# Patient Record
Sex: Female | Born: 1975
Health system: Southern US, Community
[De-identification: ages and names within clinical notes are randomized; demographics above are authoritative.]

## PROBLEM LIST (undated history)

## (undated) DIAGNOSIS — O09529 Supervision of elderly multigravida, unspecified trimester: Secondary | ICD-10-CM

## (undated) DIAGNOSIS — Z789 Other specified health status: Secondary | ICD-10-CM

## (undated) HISTORY — PX: NO PAST SURGERIES: SHX2092

---

## 2014-11-15 ENCOUNTER — Ambulatory Visit (INDEPENDENT_AMBULATORY_CARE_PROVIDER_SITE_OTHER): Payer: PRIVATE HEALTH INSURANCE | Admitting: Gynecology

## 2014-11-15 ENCOUNTER — Encounter: Payer: Self-pay | Admitting: Gynecology

## 2014-11-15 ENCOUNTER — Other Ambulatory Visit (HOSPITAL_COMMUNITY)
Admission: RE | Admit: 2014-11-15 | Discharge: 2014-11-15 | Disposition: A | Discharge status: Home / Self Care | Payer: Managed Care, Other (non HMO) | Source: Ambulatory Visit | Attending: Gynecology | Admitting: Gynecology

## 2014-11-15 VITALS — BP 130/80 | Ht 63.25 in | Wt 171.0 lb

## 2014-11-15 DIAGNOSIS — N979 Female infertility, unspecified: Secondary | ICD-10-CM | POA: Diagnosis not present

## 2014-11-15 DIAGNOSIS — N915 Oligomenorrhea, unspecified: Secondary | ICD-10-CM | POA: Diagnosis not present

## 2014-11-15 DIAGNOSIS — Z113 Encounter for screening for infections with a predominantly sexual mode of transmission: Secondary | ICD-10-CM | POA: Diagnosis not present

## 2014-11-15 DIAGNOSIS — Z124 Encounter for screening for malignant neoplasm of cervix: Secondary | ICD-10-CM | POA: Diagnosis not present

## 2014-11-15 DIAGNOSIS — N941 Dyspareunia: Secondary | ICD-10-CM | POA: Diagnosis not present

## 2014-11-15 DIAGNOSIS — Z1151 Encounter for screening for human papillomavirus (HPV): Secondary | ICD-10-CM | POA: Diagnosis present

## 2014-11-15 DIAGNOSIS — Z01419 Encounter for gynecological examination (general) (routine) without abnormal findings: Secondary | ICD-10-CM | POA: Diagnosis not present

## 2014-11-15 DIAGNOSIS — IMO0002 Reserved for concepts with insufficient information to code with codable children: Secondary | ICD-10-CM | POA: Insufficient documentation

## 2014-11-15 LAB — TSH: TSH: 0.989 u[IU]/mL (ref 0.350–4.500)

## 2014-11-15 MED ORDER — DOXYCYCLINE HYCLATE 100 MG PO CAPS: 100.0000 mg | ORAL_CAPSULE | Freq: Two times a day (BID) | ORAL | Status: DC

## 2014-11-15 MED ORDER — MEDROXYPROGESTERONE ACETATE 10 MG PO TABS: 10.0000 mg | ORAL_TABLET | Freq: Every day | ORAL | Status: DC

## 2014-11-15 NOTE — Progress Notes (Signed)
   Patient a 39 year old gravida 0 para 0 who presented to the office today with several complaints as follows: #1 dyspareunia #2 irregular menstrual cycles where she would go up to 2-3 months without having any menses #3 not able to conceive over the past several years  Patient has been married for 3 years and has had unprotected intercourse and has been unable to conceive. She states that she will go up to 3 months sometimes without having a menstrual cycle. She denies any visual disturbances, unusual headaches or any double vision or nipple discharge. She states that her weight has remained about the same. She denies any GU or GI complaints. Her husband has had 2 children with a prior marriage. 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 Her last menstrual be was February 24. She stated her Pap smear was approximately 3 years ago and there was no report of any abnormal Pap smears in the past. She denies any past history of any sexually transmitted diseases or any pelvic surgery. Her alcohol consumption is only on a social basis she denies any use of illicit drugs or any smoking.  Exam: Blood pressure 130/80 weight 170 pounds height 5 feet 3 inches tall BMI 30.05 kg/m Gen. appearance: Well-developed well-nourished female in no acute distress Abdomen: Soft nontender no rebound or guarding Pelvic: Bartholin urethra Skene was within normal limits Vagina: No lesions or discharge Cervix: No lesions or discharge Uterus: Anteverted normal size shape and consistency Adnexa: No palpable mass or tenderness Rectal exam not done  Before bimanual exam was done a Pap smear was done with HPV screening along with a GC and Chlamydia culture  Assessment/plan: We with extensive discussion of causes and treatment of infertility. It appears a patient suffering from oligomenorrhea and for this reason we are checking her TSH, and prolactin will also check  an [facility] and LH in the event that she may have any signs of polycystic ovarian disease. A GC and Chlamydia culture was obtained result pending at time of this dictation a Pap smear with HPV screening was done today as well as. She is going to be scheduled for hysterosalpingogram to determine tubal patency after the start of her next cycle. If she does not start a spontaneous cycle within 30 days from her last cycle she is going to take Provera 10 mg 1 by mouth daily for 10 days of a home urine pregnancy test is negative. She was also prescribed Vibramycin 100 mg take 1 by mouth twice a day for 3 days starting the day before the HSG. Her husband also will be scheduled for semen analysis after 3 days of abstinence from intercourse. She was reminded to begin taking her prenatal vitamins for neural tube defect prophylaxis on a daily basis. She will then return to the office for consultation to discuss the results and possibly consider ovulation induction medication of the above tests are normal. I provided her with detail explanation of the above in Spanish as well as handwritten instructions were provided.

## 2014-11-15 NOTE — Patient Instructions (Signed)
Histerosalpingografa (Hysterosalpingography) La histerosalpingografa es un procedimiento para observar el interior del tero y las trompas de Falopio. Durante este procedimiento, se inyecta una sustancia de contraste en el tero, a travs de la vagina y el cuello del tero para poder visualizar el tero mientras se toman imgenes radiogrficas. Este procedimiento puede ayudar al mdico a diagnosticar tumores, adherencias o anormalidades estructurales en el tero. Generalmente, se utiliza para determinar las razones por las que una mujer no puede tener hijos (infertilidad). El procedimiento suele durar entre 15 y 30 minutos. INFORME A SU MDICO:  Cualquier alergia que tenga.  Todos los medicamentos que utiliza, incluidos vitaminas, hierbas, gotas oftlmicas, cremas y medicamentos de venta libre.  Problemas previos que usted o los miembros de su familia hayan tenido con el uso de anestsicos.  Enfermedades de la sangre.  Cirugas previas.  Afecciones mdicas que tenga. RIESGOS Y COMPLICACIONES  En general, se trata de un procedimiento seguro. Sin embargo, como en cualquier procedimiento, pueden surgir problemas. Estos son algunos posibles problemas:  Infeccin en el revestimiento interior del tero (endometritis) o en las trompas de Falopio (salpingitis).  Dao o perforacin del tero o las trompas de Falopio.  Reaccin alrgica a la sustancia de contraste utilizada para tomar la radiografa. ANTES DEL PROCEDIMIENTO   Debe planificar el procedimiento para despus que finalice su perodo, pero antes de su prxima ovulacin. Esto ocurre por lo general entre los das 5 y 10 de su ltimo perodo. El Da 1 es el primer da de su perodo.  Consulte a su mdico si debe cambiar o suspender los medicamentos que toma habitualmente.  Podr comer y beber normalmente.  Antes de comenzar el procedimiento, vace la vejiga. PROCEDIMIENTO  Es posible que le administren un medicamento para relajarse  (sedante) o un analgsico de venta libre para aliviar las molestias durante el procedimiento.  Deber acostarse en una mesa de radiografas con los pies en los estribos.  Le colocarn en la vagina un dispositivo llamado espculo. Esto permite al mdico observar el interior de la vagina hasta el cuello del tero.  El cuello del tero se lava con un jabn especial.  Luego se pasa un tubo delgado y flexible a travs del cuello del tero hasta el tero.  Por el tubo se colocar una sustancia de contraste.  Le tomarn varias radiografas a medida que el contraste pasa a travs del tero y las trompas de Falopio.  Despus del procedimiento se retira el tubo. DESPUS DEL PROCEDIMIENTO   La mayor parte de la sustancia de contraste se eliminar de la vagina naturalmente. Puede ser necesario que use un apsito sanitario.  Puede sentir clicos leves y notar una hemorragia vaginal leve. Luego de 24 horas deben desaparecer.  Pregunte cundo estarn listos los resultados. Asegrese de obtener los resultados. Document Released: 11/15/2011 Document Revised: 08/28/2013 ExitCare Patient Information 2015 [employer]. This information is not intended to replace advice given to you by your health care provider. Make sure you discuss any questions you have with your health care provider. Infertilidad (Infertility) QU ES LA INFERTILIDAD?  La infertilidad normalmente se define como la incapacidad para quedar embarazada luego de un ao de relaciones sexuales regulares sin la utilizacin de mtodos anticonceptivos. O la incapacidad de llevar a trmino un embarazo y tener el beb. La tasa de infertilidad en los [place] es de alrededor del 10%. El embarazo es el resultado de una cadena de sucesos. La mujer debe liberar el vulo de uno de sus ovarios (ovulacin).   El vulo debe fertilizarse con el esperma. Luego viaja a travs de las trompas de Falopio hacia el tero (matriz), donde se une a la pared del  tero y crece. El hombre debe tener suficiente esperma y el esperma debe unirse con el vulo (fertilizar) en el momento justo. El vulo fertilizado debe luego unirse al interior del tero. Esto parece simple, pero pueden ocurrir muchas cosas que evitan que el embarazo se produzca.  DE QUIN ES EL PROBLEMA?  Un 20% de los casos de infertilidad se deben a problemas del hombres (factores masculinos) y un 65% se debe a problemas de la mujer (factores femeninos). Otras causas pueden ser una combinacin de factores masculinos y femeninos o a causas desconocidas.  CULES SON LAS CAUSAS DE LA INFERTILIDAD EN EL HOMBRE?  La infertilidad en el hombre a menudo se debe a problemas para producir el esperma o hacer que el mismo llegue al vulo. Los problemas de esperma pueden existir desde el nacimiento o desarrollarse ms tarde debido a enfermedades o lesiones. Algunos hombres no producen esperma, o producen muy poco (oligospermia). Entre otros problemas se incluyen:  Disfuncin sexual  Problemas hormonales o endocrinos.  La edad. La fertilidad del hombre disminuye con la edad, pero no tan temprano como la femenina.  Infecciones.  Problemas congnitos Defectos de nacimiento, como ausencia de los tubos que transportan el esperma (conductos deferentes).  Problemas genticos o de cromosomas.  Problemas de anticuerpos antiesperma.  Eyaculacin retrgrada (el esperma va hacia la vejiga).  Varicoceles, espermatoceles o tumores en los testculos.  El estilo de vida puede influir en el nmero y la calidad del esperma del hombre.  El alcohol y las drogas pueden reducir temporalmente la calidad del esperma.  Las toxinas ambientales, como los pesticidas y el plomo, pueden ocasionar algunos casos de infertilidad en hombres. CULES SON LAS CAUSAS DE LA INFERTILIDAD EN LA MUJER?   La infertilidad en las mujeres est causada mayormente por problemas con la ovulacin. Sin ovulacin, el vulo no puede  fertilizarse.  Seales de problemas de ovulacin son perodos menstruales irregulares o ningn perodo.  Factores simples del estilo de vida, como el estrs, la dieta, o el entrenamiento deportivo, pueden afectar el balance hormonal de una persona.  La edad. La fertilidad comienza a disminuir en las mujeres alrededor de los 30 aos y empeora a partir de los 37.  Mucho menos a menudo, un desequilibrio hormonal por un problema mdico serio como un tumor en la glndula pituitaria, tiroides u otra enfermedad mdica crnica pueden ocasionar problemas de ovulacin.  Infecciones plvicas.  Sndrome de ovarios poliqusticos (aumento de hormonas masculinas, incapacidad de ovular).  Consumo de alcohol o drogas.  Toxinas ambientales, radiacin, pesticidas y ciertos qumicos.  La edad es un factor importante en la infertilidad femenina.  La capacidad de los ovarios de una mujer para producir vulos disminuye con la edad, especialmente despus de los 35 aos. Alrededor de un tercio de las parejas en las que la mujer tiene ms de 35 aos tendr problemas de infertilidad.  En el momento en el que alcanza la menopausia, cuando su perodo menstrual se detiene, la mujer ya no puede producir vulos ni quedar embarazada.  Otros problemas tambin pueden llevar a la infertilidad en las mujeres. Si las trompas de Falopio estn bloqueadas en uno o los dos extremos, el vulo no puede pasar a travs de los tubos hacia el tero. Tejido cicatrizal (adhesiones) en la pelvis que pueden obstruir las trompas. Esto puede dar como resultado   una enfermedad inflamatoria plvica, endometriosis o una ciruga por embarazo ectpico (en la que el vulo fertilizado se ha implantado fuera del tero) o cualquier ciruga plvica o abdominal que ocasione adherencias.  Tumores fibroides o plipos en el tero.  Anormalidades congnitas (de nacimiento) del tero.  Infecciones en el cuello del tero (cervicitis).  Estenosis cervical  (estrechamiento).  Mucosidad cervical anormal.  Sndrome poliqustico de los ovarios.  Tener relaciones sexuales muy seguidas (todos los das o 4 a 5 veces por semana).  Obesidad.  Anorexia.  Dficit nutricional.  Mucho ejercicio, con prdida de grasa corporal.  DES. Su madre ha recibido la hormona dietilstilbesterol cuando estaba embarazada de usted. CMO SE ANALIZA LA INFERTILIDAD?  Si ha estado tratando de quedar embarazada sin xito, podra querer buscar ayuda mdica. No debera esperar un ao de intentos sin xito antes de buscar ayuda profesional si:  Tiene mas de 35 aos de edad  Tiene razones para creer que podra tener problemas de fertilidad. Un examen mdico determinar las causas de infertilidad de la pareja, Normalmente el proceso comienza con:  Exmenes fsicos  Historias clnicas de ambos miembros de la pareja.  Historiales sexuales de ambos miembros de la pareja. Si no hay un problema evidente, como relaciones sexuales en tiempos inadecuados o ausencia de ovulacin, se necesitarn realizar anlisis.   Para el hombre, los anlisis normalmente comienzan con pruebas de semen para observar:  La cantidad de esperma.  La forma del esperma.  El movimiento del esperma.  Realizar un historial clnico y quirrgico completo.  Examen fsico.  Control de infecciones en los rganos reproductivos. Podrn realizarle anlisis hormonales.   Para la mujer, el primer paso del anlisis es saber si ovula cada mes. Hay diferentes modos de realizar esto. Por ejemplo, puede llevar un registro de los cambios en la temperatura corporal por la maana y la textura de la mucosidad cervical. Otra herramienta es un kit de prueba de ovulacin casero, que puede comprarse en la farmacia.  Tambin pueden realizarse controles de ovulacin en el consultorio mdico, mediante anlisis de sangre para observar los niveles de hormonas o pruebas de ultrasonido en los ovarios. Si la mujer est  ovulando, se necesitarn realizar ms exmenes. Algunas femeninas comunes incluyen:  Histerosalpingografa: Se realizan rayos x de las trompas de Falopio y el tero con una inyeccin de contraste. Mostrar si las trompas estn abiertas y la forma del tero.  Laparoscopa: Un anlisis de las trompas y otros rganos femeninos para encontrar enfermedades. Se utiliza un tubo con iluminacin llamado laparoscopio para observar el interior del abdomen.  Biopsia endometrial: Se toma una muestra del tejido del tero el primer da del perodo menstrual, para ver si el tejido le indica si est ovulando.  Prueba de ultrasonido transvaginal: Examina los rganos femeninos.  Histeroscopa: Utiliza un tubo con iluminacin para examinar el cuello del tero y el tero y ver si hay anormalidades dentro del mismo. TRATAMIENTO Dependiendo de los resultados de las pruebas, se sugerirn tratamientos diferentes. El tratamiento depende de la causa. Entre el 85 y el 90% de los casos de infertilidad se tratan con drogas o ciruga.   Hay varias drogas para la fertilidad que pueden utilizarse para las mujeres con problemas de ovulacin. Es importante hablar con el profesional que la asiste sobre la droga a utilizar. Deber comprender los beneficios y efectos colaterales de las drogas. Segn el tipo de droga para la fertilidad y la dosis utilizada, algunas mujeres podran tener embarazos mltiples (mellizos).  De ser   necesario, se puede realizar una ciruga para reparar daos en los ovarios de la mujer, trompas de Falopio, el cuello del tero o el tero.  Tratamiento quirrgico o mdico para la endometriosis o el sndrome de ovario poliqustico. A veces, los problemas de fertilidad en el hombre pueden corregirse con medicamentos o ciruga.  Inseminacin intrauterina, IUI, de esperma en concordancia con la ovulacin.  Cambios en el estilo de vida, si esta es la causa (prdida de peso, aumento de ejercicios, dejar de fumar,  beber excesivamente o tomar drogas ilegales).  Otros tipos de ciruga:  Extirpar tumores por dentro o sobre el tero.  Eliminar tejido cicatrizal de dentro del tero.  Arreglar trompas obstruidas.  Eliminar tejido cicatrizal en la pelvis y alrededor de los rganos femeninos. QU ES LA TECNOLOGA DE REPRODUCCIN ASISTIDA (ART)?  La tecnologa de reproduccin asistida (ART) utiliza mtodos especiales para ayudar a parejas infrtiles. En esta tcnica se manipula tanto el vulo de la mujer como el esperma del hombre. El xito depende de muchos factores. La ART puede ser cara y demandar mucho tiempo. Pero ha hecho posible que muchas parejas tengan hijos que de otra manera no hubieran podido. A continuacin se enumeran algunos mtodos:  Fertilizacin. In Vitro (FIV). In Vitro (IVF) es un procedimiento que se hizo famoso con el nacimiento en 1978 de Louise Brown, el primer "beb de probeta" del mundo. Se utiliza cuando las trompas de Falopio de la mujer estn bloqueadas o cuando el hombre tiene poco esperma. Se utiliza una droga para estimular los ovarios y producir mltiples vulos. Una vez maduros, los vulos se retiran y se colocan en una placa de cultivo con el esperma del hombre para la fertilizacin. Luego de aproximadamente 40 horas, los vulos se examinan para ver si han sido fertilizados por el esperma y se han dividido en clulas. Esos vulos fertilizados (embriones) se colocan luego en el tero de la mujer. Esto evita el paso por las trompas de Falopio.  La transferencia intrafalopiana de gametas (GIFT) es similar al IVF, pero se utiliza cuando la mujer tiene al menos una trompa de falopio normal. Se colocan de tres a cinco vulos en la trompa de Falopio, junto con el esperma del hombre, para que la fertilizacin se realice dentro del cuerpo de la mujer.  La transferencia intrafalopiana de cigotos (ZIFT), tambin se denomina transferencia embrionaria, y combina el IVF con el GIFT. Los vulos  retirados de los ovarios de la mujer se fertilizan en el laboratorio y se colocan en las trompas de Falopio en lugar de en el tero.  Los procedimientos de ART a menudo suponen la utilizacin de donantes de vulos (de otra mujer) o de embriones congelados previamente. Los donantes de vulos pueden utilizarse si la mujer tiene problemas en los ovarios o posee una enfermedad gentica que podra transmitirla al beb.  Cuando se realiza una ART hay mayor riesgo de embarazos mltiples, mellizos, trillizos o ms.  La inyeccin de esperma intracitoplasmtica es un procedimiento que inyecta un espermatozoide en el vulo para fertilizarlo.  El trasplante embrionario es un procedimiento que comienza con un embrin que se ha desarrollado en un medio especial (solucin qumica) preparado para mantener el embrin vivo por 2 a 5 das, y luego trasplantarlo en el tero. En los casos en los que no puede encontrarse la causa y el embarazo no se produce, podr considerarse la adopcin. Document Released: 09/12/2007 Document Revised: 11/15/2011 ExitCare Patient Information 2015 [employer]. This information is not intended to replace   advice given to you by your health care provider. Make sure you discuss any questions you have with your health care provider.

## 2014-11-15 NOTE — Addendum Note (Signed)
Addended by: Thurnell Garbe A on: 11/15/2014 02:19 PM   Modules accepted: Orders

## 2014-11-16 LAB — PROLACTIN: PROLACTIN: 7.9 ng/mL

## 2014-11-16 LAB — LUTEINIZING HORMONE: LH: 1.3 m[IU]/mL

## 2014-11-16 LAB — GC/CHLAMYDIA PROBE AMP
CT PROBE, AMP APTIMA: NEGATIVE
GC Probe RNA: NEGATIVE

## 2014-11-16 LAB — FOLLICLE STIMULATING HORMONE: FSH: 2.4 m[IU]/mL

## 2014-11-19 LAB — CYTOLOGY - PAP

## 2014-12-27 ENCOUNTER — Ambulatory Visit (INDEPENDENT_AMBULATORY_CARE_PROVIDER_SITE_OTHER): Payer: PRIVATE HEALTH INSURANCE | Admitting: Women's Health

## 2014-12-27 ENCOUNTER — Ambulatory Visit: Payer: PRIVATE HEALTH INSURANCE | Admitting: Women's Health

## 2014-12-27 ENCOUNTER — Ambulatory Visit (INDEPENDENT_AMBULATORY_CARE_PROVIDER_SITE_OTHER): Payer: PRIVATE HEALTH INSURANCE

## 2014-12-27 ENCOUNTER — Encounter: Payer: Self-pay | Admitting: Women's Health

## 2014-12-27 ENCOUNTER — Other Ambulatory Visit: Payer: Self-pay | Admitting: Women's Health

## 2014-12-27 VITALS — BP 140/80 | Ht 63.0 in | Wt 177.0 lb

## 2014-12-27 DIAGNOSIS — N912 Amenorrhea, unspecified: Secondary | ICD-10-CM

## 2014-12-27 DIAGNOSIS — O09511 Supervision of elderly primigravida, first trimester: Secondary | ICD-10-CM

## 2014-12-27 DIAGNOSIS — O3680X0 Pregnancy with inconclusive fetal viability, not applicable or unspecified: Secondary | ICD-10-CM | POA: Diagnosis not present

## 2014-12-27 LAB — PREGNANCY, URINE: Preg Test, Ur: POSITIVE

## 2014-12-27 NOTE — Patient Instructions (Signed)
First Trimester of Pregnancy The first trimester of pregnancy is from week 1 until the end of week 12 (months 1 through 3). During this time, your baby will begin to develop inside you. At 6-8 weeks, the eyes and face are formed, and the heartbeat can be seen on ultrasound. At the end of 12 weeks, all the baby's organs are formed. Prenatal care is all the medical care you receive before the birth of your baby. Make sure you get good prenatal care and follow all of your doctor's instructions. HOME CARE  Medicines  Take medicine only as told by your doctor. Some medicines are safe and some are not during pregnancy.  Take your prenatal vitamins as told by your doctor.  Take medicine that helps you poop (stool softener) as needed if your doctor says it is okay. Diet  Eat regular, healthy meals.  Your doctor will tell you the amount of weight gain that is right for you.  Avoid raw meat and uncooked cheese.  If you feel sick to your stomach (nauseous) or throw up (vomit):  Eat 4 or 5 small meals a day instead of 3 large meals.  Try eating a few soda crackers.  Drink liquids between meals instead of during meals.  If you have a hard time pooping (constipation):  Eat high-fiber foods like fresh vegetables, fruit, and whole grains.  Drink enough fluids to keep your pee (urine) clear or pale yellow. Activity and Exercise  Exercise only as told by your doctor. Stop exercising if you have cramps or pain in your lower belly (abdomen) or low back.  Try to avoid standing for long periods of time. Move your legs often if you must stand in one place for a long time.  Avoid heavy lifting.  Wear low-heeled shoes. Sit and stand up straight.  You can have sex unless your doctor tells you not to. Relief of Pain or Discomfort  Wear a good support bra if your breasts are sore.  Take warm water baths (sitz baths) to soothe pain or discomfort caused by hemorrhoids. Use hemorrhoid cream if your  doctor says it is okay.  Rest with your legs raised if you have leg cramps or low back pain.  Wear support hose if you have puffy, bulging veins (varicose veins) in your legs. Raise (elevate) your feet for 15 minutes, 3-4 times a day. Limit salt in your diet. Prenatal Care  Schedule your prenatal visits by the twelfth week of pregnancy.  Write down your questions. Take them to your prenatal visits.  Keep all your prenatal visits as told by your doctor. Safety  Wear your seat belt at all times when driving.  Make a list of emergency phone numbers. The list should include numbers for family, friends, the hospital, and police and fire departments. General Tips  Ask your doctor for a referral to a local prenatal class. Begin classes no later than at the start of month 6 of your pregnancy.  Ask for help if you need counseling or help with nutrition. Your doctor can give you advice or tell you where to go for help.  Do not use hot tubs, steam rooms, or saunas.  Do not douche or use tampons or scented sanitary pads.  Do not cross your legs for long periods of time.  Avoid litter boxes and soil used by cats.  Avoid all smoking, herbs, and alcohol. Avoid drugs not approved by your doctor.  Visit your dentist. At home, brush your teeth  with a soft toothbrush. Be gentle when you floss. GET HELP IF:  You are dizzy.  You have mild cramps or pressure in your lower belly.  You have a nagging pain in your belly area.  You continue to feel sick to your stomach, throw up, or have watery poop (diarrhea).  You have a bad smelling fluid coming from your vagina.  You have pain with peeing (urination).  You have increased puffiness (swelling) in your face, hands, legs, or ankles. GET HELP RIGHT AWAY IF:   You have a fever.  You are leaking fluid from your vagina.  You have spotting or bleeding from your vagina.  You have very bad belly cramping or pain.  You gain or lose weight  rapidly.  You throw up blood. It may look like coffee grounds.  You are around people who have [place] measles, fifth disease, or chickenpox.  You have a very bad headache.  You have shortness of breath.  You have any kind of trauma, such as from a fall or a car accident. Document Released: 02/09/2008 Document Revised: 01/07/2014 Document Reviewed: 07/03/2013 [facility] Patient Information 2015 [place], [place]. This information is not intended to replace advice given to you by your health care provider. Make sure you discuss any questions you have with your health care provider.

## 2014-12-27 NOTE — Progress Notes (Signed)
Patient ID: [name], female   DOB: [birth year], 39 y.o.   MRN: [id number] Presents for follow-up of 3 positive home pregnancy tests. LMP 10/30/2014. History of infertility, dyspareunia, and irregular cycles. Denies cramping, bleeding, or abdominal pain. Father and mother both with diabetes, no personal history of. Not currently taking MVI.   Exam: Appears well,  +UPT.  Ultrasound: Intrauterine gestational sac seen with yolk sac. Fetal pole seen with FHT 166. Ovaries appear normal. No free fluid seen. T/V images.  First trimester  - 8 w 3 d  Plan: Congratulations given. Is aware she will need to establish with OB. Copies of ultrasound, Pap, and recent blood work given. Safe pregnancy behaviors reviewed. Prenatal vitamin daily recommended.

## 2015-02-06 ENCOUNTER — Encounter (HOSPITAL_COMMUNITY): Payer: Self-pay | Admitting: Obstetrics and Gynecology

## 2015-02-06 ENCOUNTER — Other Ambulatory Visit (HOSPITAL_COMMUNITY): Payer: Self-pay | Admitting: Obstetrics and Gynecology

## 2015-02-06 DIAGNOSIS — O280 Abnormal hematological finding on antenatal screening of mother: Secondary | ICD-10-CM

## 2015-02-06 DIAGNOSIS — O09522 Supervision of elderly multigravida, second trimester: Secondary | ICD-10-CM

## 2015-02-06 DIAGNOSIS — Z3A18 18 weeks gestation of pregnancy: Secondary | ICD-10-CM

## 2015-02-06 DIAGNOSIS — Z3689 Encounter for other specified antenatal screening: Secondary | ICD-10-CM

## 2015-03-05 ENCOUNTER — Other Ambulatory Visit (HOSPITAL_COMMUNITY): Payer: Managed Care, Other (non HMO)

## 2015-03-05 ENCOUNTER — Encounter (HOSPITAL_COMMUNITY): Payer: Managed Care, Other (non HMO)

## 2015-03-06 ENCOUNTER — Encounter (HOSPITAL_COMMUNITY): Payer: Self-pay

## 2015-03-06 ENCOUNTER — Ambulatory Visit (HOSPITAL_COMMUNITY)
Admission: RE | Admit: 2015-03-06 | Discharge: 2015-03-06 | Disposition: A | Discharge status: Home / Self Care | Payer: Managed Care, Other (non HMO) | Source: Ambulatory Visit | Attending: Obstetrics and Gynecology | Admitting: Obstetrics and Gynecology

## 2015-03-06 DIAGNOSIS — Z0000 Encounter for general adult medical examination without abnormal findings: Secondary | ICD-10-CM | POA: Insufficient documentation

## 2015-03-06 DIAGNOSIS — O09512 Supervision of elderly primigravida, second trimester: Secondary | ICD-10-CM | POA: Diagnosis not present

## 2015-03-06 DIAGNOSIS — O280 Abnormal hematological finding on antenatal screening of mother: Secondary | ICD-10-CM

## 2015-03-06 DIAGNOSIS — O283 Abnormal ultrasonic finding on antenatal screening of mother: Secondary | ICD-10-CM | POA: Diagnosis not present

## 2015-03-06 DIAGNOSIS — Z36 Encounter for antenatal screening of mother: Secondary | ICD-10-CM | POA: Insufficient documentation

## 2015-03-06 DIAGNOSIS — O09529 Supervision of elderly multigravida, unspecified trimester: Secondary | ICD-10-CM | POA: Insufficient documentation

## 2015-03-06 DIAGNOSIS — O09522 Supervision of elderly multigravida, second trimester: Secondary | ICD-10-CM

## 2015-03-06 DIAGNOSIS — Z3A18 18 weeks gestation of pregnancy: Secondary | ICD-10-CM | POA: Diagnosis not present

## 2015-03-06 DIAGNOSIS — Z3689 Encounter for other specified antenatal screening: Secondary | ICD-10-CM

## 2015-03-06 HISTORY — DX: Other specified health status: Z78.9

## 2015-03-06 HISTORY — DX: Supervision of elderly multigravida, unspecified trimester: O09.529

## 2015-03-07 ENCOUNTER — Encounter (HOSPITAL_COMMUNITY): Payer: Self-pay

## 2015-03-07 DIAGNOSIS — O09512 Supervision of elderly primigravida, second trimester: Secondary | ICD-10-CM | POA: Insufficient documentation

## 2015-03-07 NOTE — Progress Notes (Signed)
Genetic Counseling  High-Risk Gestation Note  Appointment Date:  03/06/2015 Referred By: Wendy Stammer, MD Date of Birth:  10/22/1975 Partner:  [name]   Pregnancy History: G1P0000 Estimated Date of Delivery: 08/06/15 Estimated Gestational Age: 77w1dAttending: PBenjaman Lobe MD   [name] and her partner, [name] were seen for genetic counseling regarding a maternal age of 39y.o. and an increased risk for Down syndrome based on Part I Sequential Screening.   In Summary:   Discussed maternal age related risk for fetal aneuploidy  First screen increased risk for Down syndrome (1:41)  Low PAPP-A (0.34 MoM) on first screen; Discussed associations with increased risk for adverse pregnancy outcomes  Detailed ultrasound performed today  Patient declined noninvasive prenatal screening (NIPS) and amniocentesis  Follow-up ultrasound scheduled for 04/16/15 to assess fetal growth and complete fetal anatomic survey  They were counseled regarding maternal age and the association with risk for chromosome conditions due to nondisjunction with aging of the ova.   We reviewed chromosomes, nondisjunction, and the associated 1 in 39risk for fetal aneuploidy related to a maternal age of 39y.o. at 39w1destation.  They were counseled that the risk for aneuploidy decreases as gestational age increases, accounting for those pregnancies which spontaneously abort.  We specifically discussed Down syndrome (trisomy 2156 trisomies 1376nd 1881and sex chromosome aneuploidies (47,XXX and 47,XXY) including the common features and prognoses of each.   We also reviewed [name]'s first trimester (Part 1 Sequential) screen result and the associated increase in risk for fetal Down syndrome (1 in 98 to 1 in 41).  They were counseled regarding other explanations for a screen positive result including normal variation and differences in maternal metabolism.  In addition, we  reviewed the increased but screen negative result for trisomy 18 (1 in 380 to 1 in 110).  They understand maternal serum screening provides a pregnancy specific risk for Down syndrome, but is not considered to be diagnostic.  We specifically discussed that the level of one of the proteins analyzed on the screen, PAPP-A, was very low (0.34 MoM).  This has been associated with an increased risk for growth restriction or poor pregnancy outcome later in pregnancy; therefore, we would recommend a follow up ultrasound for fetal growth in the third trimester.  We reviewed additional available screening options including noninvasive prenatal screening (NIPS)/cell free DNA (cfDNA) testing and detailed ultrasound.  They were counseled that screening tests are used to modify a patient's a priori risk for aneuploidy, typically based on age. This estimate provides a pregnancy specific risk assessment. We reviewed the benefits and limitations of each option. Specifically, we discussed the conditions for which each test screens, the detection rates, and false positive rates of each.   Targeted ultrasound was performed today. Visualized fetal anatomy appeared normal. Limited views of fetal spine, lips, and aortic arch were obtained today. Complete ultrasound results reported separately. They were also counseled regarding diagnostic testing via amniocentesis. We reviewed the approximate 1 in 30219-758isk for complications for amniocentesis, including spontaneous pregnancy loss. After consideration of all the options, they declined NIPS and amniocentesis. Follow-up ultrasound is scheduled in 6 weeks to reassess fetal growth and complete fetal anatomic survey. They understand that screening tests cannot rule out all birth defects or genetic syndromes. The patient was advised of this limitation and states she still does not want additional testing at this time.   Both family histories were reviewed and found to be  noncontributory  for birth defects, intellectual disability, and known genetic conditions. Without further information regarding the provided family history, an accurate genetic risk cannot be calculated. Further genetic counseling is warranted if more information is obtained.  [name] denied exposure to environmental toxins or chemical agents. She denied the use of alcohol, tobacco or street drugs. She denied significant viral illnesses during the course of her pregnancy. Her medical and surgical histories were noncontributory.   I counseled this couple regarding the above risks and available options.  The approximate face-to-face time with the genetic counselor was 35 minutes.  Wendy Oman, MS,  Certified Genetic Counselor 03/07/2015

## 2015-03-11 ENCOUNTER — Other Ambulatory Visit (HOSPITAL_COMMUNITY): Payer: Self-pay | Admitting: Obstetrics and Gynecology

## 2015-04-14 ENCOUNTER — Other Ambulatory Visit (HOSPITAL_COMMUNITY): Payer: Self-pay | Admitting: Maternal and Fetal Medicine

## 2015-04-14 DIAGNOSIS — O09522 Supervision of elderly multigravida, second trimester: Secondary | ICD-10-CM

## 2015-04-14 DIAGNOSIS — Z3A24 24 weeks gestation of pregnancy: Secondary | ICD-10-CM

## 2015-04-14 DIAGNOSIS — O289 Unspecified abnormal findings on antenatal screening of mother: Secondary | ICD-10-CM

## 2015-04-14 DIAGNOSIS — O09899 Supervision of other high risk pregnancies, unspecified trimester: Secondary | ICD-10-CM

## 2015-04-14 DIAGNOSIS — O280 Abnormal hematological finding on antenatal screening of mother: Secondary | ICD-10-CM

## 2015-04-15 ENCOUNTER — Other Ambulatory Visit (HOSPITAL_COMMUNITY): Payer: Self-pay | Admitting: Obstetrics and Gynecology

## 2015-04-15 DIAGNOSIS — Z3A24 24 weeks gestation of pregnancy: Secondary | ICD-10-CM

## 2015-04-15 DIAGNOSIS — O09899 Supervision of other high risk pregnancies, unspecified trimester: Secondary | ICD-10-CM

## 2015-04-15 DIAGNOSIS — O09522 Supervision of elderly multigravida, second trimester: Secondary | ICD-10-CM

## 2015-04-15 DIAGNOSIS — O289 Unspecified abnormal findings on antenatal screening of mother: Secondary | ICD-10-CM

## 2015-04-15 DIAGNOSIS — O280 Abnormal hematological finding on antenatal screening of mother: Secondary | ICD-10-CM

## 2015-04-16 ENCOUNTER — Ambulatory Visit (HOSPITAL_COMMUNITY)
Admission: RE | Admit: 2015-04-16 | Discharge: 2015-04-16 | Disposition: A | Discharge status: Home / Self Care | Payer: 59 | Source: Ambulatory Visit | Attending: Obstetrics and Gynecology | Admitting: Obstetrics and Gynecology

## 2015-04-16 DIAGNOSIS — O283 Abnormal ultrasonic finding on antenatal screening of mother: Secondary | ICD-10-CM | POA: Diagnosis present

## 2015-04-16 DIAGNOSIS — Z3A24 24 weeks gestation of pregnancy: Secondary | ICD-10-CM

## 2015-04-16 DIAGNOSIS — O09899 Supervision of other high risk pregnancies, unspecified trimester: Secondary | ICD-10-CM

## 2015-04-16 DIAGNOSIS — O289 Unspecified abnormal findings on antenatal screening of mother: Secondary | ICD-10-CM

## 2015-04-16 DIAGNOSIS — O09522 Supervision of elderly multigravida, second trimester: Secondary | ICD-10-CM | POA: Diagnosis not present

## 2015-04-16 DIAGNOSIS — O280 Abnormal hematological finding on antenatal screening of mother: Secondary | ICD-10-CM

## 2015-08-05 ENCOUNTER — Other Ambulatory Visit: Payer: Self-pay

## 2015-10-28 DIAGNOSIS — Z3040 Encounter for surveillance of contraceptives, unspecified: Secondary | ICD-10-CM | POA: Insufficient documentation

## 2016-01-09 ENCOUNTER — Encounter (HOSPITAL_COMMUNITY): Payer: Self-pay | Admitting: *Deleted

## 2017-01-19 ENCOUNTER — Encounter: Payer: Self-pay | Admitting: Gynecology

## 2018-03-10 DIAGNOSIS — H6981 Other specified disorders of Eustachian tube, right ear: Secondary | ICD-10-CM | POA: Diagnosis not present

## 2018-09-05 DIAGNOSIS — J0100 Acute maxillary sinusitis, unspecified: Secondary | ICD-10-CM | POA: Diagnosis not present

## 2018-09-26 DIAGNOSIS — Z0000 Encounter for general adult medical examination without abnormal findings: Secondary | ICD-10-CM | POA: Diagnosis not present

## 2018-09-26 DIAGNOSIS — E559 Vitamin D deficiency, unspecified: Secondary | ICD-10-CM | POA: Diagnosis not present

## 2018-09-26 DIAGNOSIS — Z6832 Body mass index (BMI) 32.0-32.9, adult: Secondary | ICD-10-CM | POA: Diagnosis not present

## 2018-09-26 DIAGNOSIS — Z1231 Encounter for screening mammogram for malignant neoplasm of breast: Secondary | ICD-10-CM | POA: Diagnosis not present

## 2018-10-12 ENCOUNTER — Other Ambulatory Visit: Payer: Self-pay | Admitting: Medical

## 2018-10-12 DIAGNOSIS — Z1231 Encounter for screening mammogram for malignant neoplasm of breast: Secondary | ICD-10-CM

## 2018-10-24 DIAGNOSIS — J0100 Acute maxillary sinusitis, unspecified: Secondary | ICD-10-CM | POA: Diagnosis not present

## 2018-11-10 ENCOUNTER — Ambulatory Visit
Admission: RE | Admit: 2018-11-10 | Discharge: 2018-11-10 | Disposition: A | Discharge status: Home / Self Care | Payer: 59 | Source: Ambulatory Visit | Attending: Medical | Admitting: Medical

## 2018-11-10 DIAGNOSIS — Z1231 Encounter for screening mammogram for malignant neoplasm of breast: Secondary | ICD-10-CM

## 2018-11-14 ENCOUNTER — Other Ambulatory Visit: Payer: Self-pay | Admitting: Medical

## 2018-11-14 DIAGNOSIS — R928 Other abnormal and inconclusive findings on diagnostic imaging of breast: Secondary | ICD-10-CM

## 2018-11-19 DIAGNOSIS — J111 Influenza due to unidentified influenza virus with other respiratory manifestations: Secondary | ICD-10-CM | POA: Diagnosis not present

## 2018-11-24 ENCOUNTER — Other Ambulatory Visit: Payer: 59

## 2018-12-01 ENCOUNTER — Other Ambulatory Visit: Payer: Self-pay

## 2018-12-01 ENCOUNTER — Ambulatory Visit
Admission: RE | Admit: 2018-12-01 | Discharge: 2018-12-01 | Disposition: A | Discharge status: Home / Self Care | Payer: 59 | Source: Ambulatory Visit | Attending: Medical | Admitting: Medical

## 2018-12-01 ENCOUNTER — Ambulatory Visit: Payer: 59

## 2018-12-01 DIAGNOSIS — R928 Other abnormal and inconclusive findings on diagnostic imaging of breast: Secondary | ICD-10-CM

## 2019-04-25 DIAGNOSIS — Z20828 Contact with and (suspected) exposure to other viral communicable diseases: Secondary | ICD-10-CM | POA: Diagnosis not present

## 2019-04-25 DIAGNOSIS — R51 Headache: Secondary | ICD-10-CM | POA: Diagnosis not present

## 2019-04-25 DIAGNOSIS — Z1159 Encounter for screening for other viral diseases: Secondary | ICD-10-CM | POA: Diagnosis not present

## 2019-06-21 DIAGNOSIS — Z23 Encounter for immunization: Secondary | ICD-10-CM | POA: Diagnosis not present

## 2019-07-12 DIAGNOSIS — Z20828 Contact with and (suspected) exposure to other viral communicable diseases: Secondary | ICD-10-CM | POA: Diagnosis not present

## 2019-07-19 DIAGNOSIS — E559 Vitamin D deficiency, unspecified: Secondary | ICD-10-CM | POA: Diagnosis not present

## 2019-07-24 DIAGNOSIS — J018 Other acute sinusitis: Secondary | ICD-10-CM | POA: Diagnosis not present

## 2019-09-04 ENCOUNTER — Encounter: Payer: Self-pay | Admitting: Women's Health

## 2019-09-04 ENCOUNTER — Ambulatory Visit (INDEPENDENT_AMBULATORY_CARE_PROVIDER_SITE_OTHER): Payer: 59 | Admitting: Women's Health

## 2019-09-04 ENCOUNTER — Other Ambulatory Visit: Payer: Self-pay

## 2019-09-04 VITALS — BP 118/82 | Ht 63.0 in | Wt 186.0 lb

## 2019-09-04 DIAGNOSIS — Z01419 Encounter for gynecological examination (general) (routine) without abnormal findings: Secondary | ICD-10-CM | POA: Diagnosis not present

## 2019-09-04 DIAGNOSIS — Z30432 Encounter for removal of intrauterine contraceptive device: Secondary | ICD-10-CM

## 2019-09-04 DIAGNOSIS — Z113 Encounter for screening for infections with a predominantly sexual mode of transmission: Secondary | ICD-10-CM | POA: Diagnosis not present

## 2019-09-04 NOTE — Patient Instructions (Signed)
It was good to see you today Vitamin D 1000 daily Health Maintenance, Female Adopting a healthy lifestyle and getting preventive care are important in promoting health and wellness. Ask your health care provider about:  The right schedule for you to have regular tests and exams.  Things you can do on your own to prevent diseases and keep yourself healthy. What should I know about diet, weight, and exercise? Eat a healthy diet   Eat a diet that includes plenty of vegetables, fruits, low-fat dairy products, and lean protein.  Do not eat a lot of foods that are high in solid fats, added sugars, or sodium. Maintain a healthy weight Body mass index (BMI) is used to identify weight problems. It estimates body fat based on height and weight. Your health care provider can help determine your BMI and help you achieve or maintain a healthy weight. Get regular exercise Get regular exercise. This is one of the most important things you can do for your health. Most adults should:  Exercise for at least 150 minutes each week. The exercise should increase your heart rate and make you sweat (moderate-intensity exercise).  Do strengthening exercises at least twice a week. This is in addition to the moderate-intensity exercise.  Spend less time sitting. Even light physical activity can be beneficial. Watch cholesterol and blood lipids Have your blood tested for lipids and cholesterol at 43 years of age, then have this test every 5 years. Have your cholesterol levels checked more often if:  Your lipid or cholesterol levels are high.  You are older than 43 years of age.  You are at high risk for heart disease. What should I know about cancer screening? Depending on your health history and family history, you may need to have cancer screening at various ages. This may include screening for:  Breast cancer.  Cervical cancer.  Colorectal cancer.  Skin cancer.  Lung cancer. What should I know  about heart disease, diabetes, and high blood pressure? Blood pressure and heart disease  High blood pressure causes heart disease and increases the risk of stroke. This is more likely to develop in people who have high blood pressure readings, are of African descent, or are overweight.  Have your blood pressure checked: ? Every 3-5 years if you are 43-11 years of age. ? Every year if you are 5 years old or older. Diabetes Have regular diabetes screenings. This checks your fasting blood sugar level. Have the screening done:  Once every three years after age 43 if you are at a normal weight and have a low risk for diabetes.  More often and at a younger age if you are overweight or have a high risk for diabetes. What should I know about preventing infection? Hepatitis B If you have a higher risk for hepatitis B, you should be screened for this virus. Talk with your health care provider to find out if you are at risk for hepatitis B infection. Hepatitis C Testing is recommended for:  Everyone born from 1 through 1965.  Anyone with known risk factors for hepatitis C. Sexually transmitted infections (STIs)  Get screened for STIs, including gonorrhea and chlamydia, if: ? You are sexually active and are younger than 43 years of age. ? You are older than 43 years of age and your health care provider tells you that you are at risk for this type of infection. ? Your sexual activity has changed since you were last screened, and you are at increased  risk for chlamydia or gonorrhea. Ask your health care provider if you are at risk.  Ask your health care provider about whether you are at high risk for HIV. Your health care provider may recommend a prescription medicine to help prevent HIV infection. If you choose to take medicine to prevent HIV, you should first get tested for HIV. You should then be tested every 3 months for as long as you are taking the medicine. Pregnancy  If you are about  to stop having your period (premenopausal) and you may become pregnant, seek counseling before you get pregnant.  Take 400 to 800 micrograms (mcg) of folic acid every day if you become pregnant.  Ask for birth control (contraception) if you want to prevent pregnancy. Osteoporosis and menopause Osteoporosis is a disease in which the bones lose minerals and strength with aging. This can result in bone fractures. If you are 31 years old or older, or if you are at risk for osteoporosis and fractures, ask your health care provider if you should:  Be screened for bone loss.  Take a calcium or vitamin D supplement to lower your risk of fractures.  Be given hormone replacement therapy (HRT) to treat symptoms of menopause. Follow these instructions at home: Lifestyle  Do not use any products that contain nicotine or tobacco, such as cigarettes, e-cigarettes, and chewing tobacco. If you need help quitting, ask your health care provider.  Do not use street drugs.  Do not share needles.  Ask your health care provider for help if you need support or information about quitting drugs. Alcohol use  Do not drink alcohol if: ? Your health care provider tells you not to drink. ? You are pregnant, may be pregnant, or are planning to become pregnant.  If you drink alcohol: ? Limit how much you use to 0-1 drink a day. ? Limit intake if you are breastfeeding.  Be aware of how much alcohol is in your drink. In the [place] one drink equals one 12 oz bottle of beer (355 mL), one 5 oz glass of wine (148 mL), or one 1 oz glass of hard liquor (44 mL). General instructions  Schedule regular health, dental, and eye exams.  Stay current with your vaccines.  Tell your health care provider if: ? You often feel depressed. ? You have ever been abused or do not feel safe at home. Summary  Adopting a healthy lifestyle and getting preventive care are important in promoting health and wellness.  Follow your  health care provider's instructions about healthy diet, exercising, and getting tested or screened for diseases.  Follow your health care provider's instructions on monitoring your cholesterol and blood pressure. This information is not intended to replace advice given to you by your health care provider. Make sure you discuss any questions you have with your health care provider. Document Released: 03/08/2011 Document Revised: 08/16/2018 Document Reviewed: 08/16/2018 Elsevier Patient Education  2020 [employer].

## 2019-09-04 NOTE — Addendum Note (Signed)
Addended by: Gae Gallop T on: 09/04/2019 03:12 PM   Modules accepted: Orders

## 2019-09-04 NOTE — Progress Notes (Signed)
[name] 1976/01/29 [id number]    History:    Presents for annual exam.  08/2015 Mirena IUD amenorrhea.  Placed 1 month after delivery of daughter at [facility].  Separated from husband questionable infidelity would like IUD removed today.  History of regular monthly 4 to 5-day cycles.  Normal Pap and mammogram history.  Reports normal labs at primary care within the last year, low vitamin D currently on 50,000 IUs weekly..  Past medical history, past surgical history, family history and social history were all reviewed and documented in the EPIC chart.  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  [name] is 43 years old, thriving full custody.  Parents diabetes.  Did not have gestational diabetes  ROS:  A ROS was performed and pertinent positives and negatives are included.  Exam:  Vitals:   09/04/19 1409  BP: 118/82  Weight: 186 lb (84.4 kg)  Height: 5' 3"  (1.6 m)   Body mass index is 32.95 kg/m.   General appearance:  Normal Thyroid:  Symmetrical, normal in size, without palpable masses or nodularity. Respiratory  Auscultation:  Clear without wheezing or rhonchi Cardiovascular  Auscultation:  Regular rate, without rubs, murmurs or gallops  Edema/varicosities:  Not grossly evident Abdominal  Soft,nontender, without masses, guarding or rebound.  Liver/spleen:  No organomegaly noted  Hernia:  None appreciated  Skin  Inspection:  Grossly normal   Breasts: Examined lying and sitting.     Right: Without masses, retractions, discharge or axillary adenopathy.     Left: Without masses, retractions, discharge or axillary adenopathy. Gentitourinary   Inguinal/mons:  Normal without inguinal adenopathy  External genitalia:  Normal  BUS/Urethra/Skene's glands:  Normal  Vagina:  Normal  Cervix:  Normal IUD strings in os grasped IUD removed intact shown to patient and discarded  Uterus:  normal in size, shape and contour.  Midline and mobile  Adnexa/parametria:     Rt: Without  masses or tenderness.   Lt: Without masses or tenderness.  Anus and perineum: Normal  Digital rectal exam: Normal sphincter tone without palpated masses or tenderness  Assessment/Plan:  44 y.o. separated HF G1, P1 for annual exam with no complaints of vaginal discharge, urinary symptoms, abdominal pain or fever.  Requesting Mirena IUD to be removed.  08/2015 Mirena IUD amenorrheic History of recurrent UTIs asymptomatic today Obesity STD screen  Plan: SBEs, continue annual screening mammogram, calcium rich foods, vitamin D 1000 daily after complaining 50,000 IUs weekly from primary care.  Reviewed importance of increasing exercise and decreasing calorie/carbs.  Instructed to call if cycles do not regulate in the next few months, return to office for contraception management becomes sexually active, condoms discussed.  Counseling reviewed for impending divorce denies need.  Pap with HR HPV typing, GC/chlamydia, HIV, RPR.  Pap screening guidelines reviewed.    [name], 2:47 PM 09/04/2019

## 2019-09-05 LAB — RPR: RPR Ser Ql: NONREACTIVE

## 2019-09-05 LAB — HIV ANTIBODY (ROUTINE TESTING W REFLEX): HIV 1&2 Ab, 4th Generation: NONREACTIVE

## 2019-09-06 LAB — PAP IG, CT-NG NAA, HPV HIGH-RISK
C. trachomatis RNA, TMA: NOT DETECTED
HPV DNA High Risk: NOT DETECTED
N. gonorrhoeae RNA, TMA: NOT DETECTED

## 2019-09-08 ENCOUNTER — Other Ambulatory Visit: Payer: Self-pay | Admitting: Women's Health

## 2019-09-08 MED ORDER — FLUCONAZOLE 150 MG PO TABS: 150.0000 mg | ORAL_TABLET | Freq: Once | ORAL | 0 refills | Status: AC

## 2019-09-12 MED ORDER — FLUCONAZOLE 150 MG PO TABS: 150.0000 mg | ORAL_TABLET | Freq: Once | ORAL | 0 refills | Status: AC

## 2019-11-15 ENCOUNTER — Other Ambulatory Visit: Payer: Self-pay

## 2019-11-15 ENCOUNTER — Ambulatory Visit (INDEPENDENT_AMBULATORY_CARE_PROVIDER_SITE_OTHER): Payer: 59 | Admitting: Legal Medicine

## 2019-11-15 ENCOUNTER — Encounter: Payer: Self-pay | Admitting: Legal Medicine

## 2019-11-15 VITALS — BP 140/90 | HR 91 | Temp 98.4°F | Ht 63.0 in | Wt 190.6 lb

## 2019-11-15 DIAGNOSIS — E669 Obesity, unspecified: Secondary | ICD-10-CM

## 2019-11-15 DIAGNOSIS — Z0000 Encounter for general adult medical examination without abnormal findings: Secondary | ICD-10-CM | POA: Diagnosis not present

## 2019-11-15 DIAGNOSIS — E559 Vitamin D deficiency, unspecified: Secondary | ICD-10-CM

## 2019-11-15 MED ORDER — VITAMIN D (ERGOCALCIFEROL) 1.25 MG (50000 UNIT) PO CAPS: 50000.0000 [IU] | ORAL_CAPSULE | ORAL | 3 refills | Status: DC

## 2019-11-15 NOTE — Progress Notes (Signed)
Established Patient Office Visit  Subjective:  Patient ID: [name], female    DOB: 03/27/1976  Age: 44 y.o. MRN: [id number]  CC:  Chief Complaint  Patient presents with  . Annual Exam    HPI [name] presents for CPE.  She is healthy and has low vitamin d and has pregnancy induced diabetes.  She is on OC.   She has hypertension of pregnancy.  Past Medical History:  Diagnosis Date  . AMA (advanced maternal age) multigravida 19+   . Medical history non-contributory     Past Surgical History:  Procedure Laterality Date  . CESAREAN SECTION  2016  . NO PAST SURGERIES      Family History  Problem Relation Age of Onset  . Diabetes Mother   . High blood pressure Mother   . Diabetes Father   . High blood pressure Father   . Diabetes Paternal Grandmother   . Diabetes Paternal Grandfather   . High blood pressure Sister     Social History   Socioeconomic History  . Marital status: Single    Spouse name: Not on file  . Number of children: Not on file  . Years of education: Not on file  . Highest education level: Not on file  Occupational History  . Not on file  Tobacco Use  . Smoking status: Never Smoker  . Smokeless tobacco: Never Used  Substance and Sexual Activity  . Alcohol use: Yes    Alcohol/week: 0.0 standard drinks    Comment: BEER - WEEKENDS  . Drug use: No  . Sexual activity: Yes    Comment: 1ST INTERCOURSE- 63, PARTNERS- 7  Other Topics Concern  . Not on file  Social History Narrative  . Not on file   Social Determinants of Health   Financial Resource Strain:   . Difficulty of Paying Living Expenses:   Food Insecurity:   . Worried About Charity fundraiser in the Last Year:   . Arboriculturist in the Last Year:   Transportation Needs:   . Film/video editor (Medical):   Marland Kitchen Lack of Transportation (Non-Medical):   Physical Activity:   . Days of Exercise per Week:   . Minutes of Exercise per Session:   Stress:   . Feeling  of Stress :   Social Connections:   . Frequency of Communication with Friends and Family:   . Frequency of Social Gatherings with Friends and Family:   . Attends Religious Services:   . Active Member of Clubs or Organizations:   . Attends Archivist Meetings:   Marland Kitchen Marital Status:   Intimate Partner Violence:   . Fear of Current or Ex-Partner:   . Emotionally Abused:   Marland Kitchen Physically Abused:   . Sexually Abused:     Outpatient Medications Prior to Visit  Medication Sig Dispense Refill  . levocetirizine (XYZAL) 5 MG tablet Take 5 mg by mouth every evening.    Marland Kitchen omeprazole (PRILOSEC) 40 MG capsule Take 1 capsule by mouth daily.    . Vitamin D, Ergocalciferol, (DRISDOL) 50000 UNITS CAPS capsule Take 50,000 Units by mouth every 7 (seven) days.     No facility-administered medications prior to visit.    No Known Allergies  ROS Review of Systems  Constitutional: Negative.   HENT: Negative.   Eyes: Negative.   Respiratory: Negative.   Cardiovascular: Negative.   Gastrointestinal: Negative.   Endocrine: Negative.   Genitourinary: Negative.   Musculoskeletal: Negative.  Skin: Negative.   Allergic/Immunologic: Negative.   Neurological: Negative.   Hematological: Negative.   Psychiatric/Behavioral: Negative.       Objective:    Physical Exam  Constitutional: She is oriented to person, place, and time. She appears well-developed and well-nourished.  HENT:  Head: Normocephalic and atraumatic.  Eyes: Pupils are equal, round, and reactive to light. Conjunctivae and EOM are normal.  Cardiovascular: Normal rate, regular rhythm and normal heart sounds.  Pulmonary/Chest: Effort normal and breath sounds normal.  Abdominal: Soft. Bowel sounds are normal.  Musculoskeletal:        General: Normal range of motion.     Cervical back: Normal range of motion and neck supple.  Neurological: She is alert and oriented to person, place, and time. She has normal reflexes.  Skin:  Skin is warm and dry.  Psychiatric: She has a normal mood and affect.  Vitals reviewed.   BP 140/90   Pulse 91   Temp 98.4 F (36.9 C)   Ht 5' 3"  (1.6 m)   Wt 190 lb 9.6 oz (86.5 kg)   SpO2 98%   BMI 33.76 kg/m  Wt Readings from Last 3 Encounters:  11/15/19 190 lb 9.6 oz (86.5 kg)  09/04/19 186 lb (84.4 kg)  04/16/15 185 lb 12.8 oz (84.3 kg)     Health Maintenance Due  Topic Date Due  . TETANUS/TDAP  Never done    There are no preventive care reminders to display for this patient.  Lab Results  Component Value Date   TSH 0.989 11/15/2014   No results found for: WBC, HGB, HCT, MCV, PLT No results found for: NA, K, CHLORIDE, CO2, GLUCOSE, BUN, CREATININE, BILITOT, ALKPHOS, AST, ALT, PROT, ALBUMIN, CALCIUM, ANIONGAP, EGFR, GFR No results found for: CHOL No results found for: HDL No results found for: LDLCALC No results found for: TRIG No results found for: CHOLHDL No results found for: HGBA1C     Assessment & Plan:   Problem List Items Addressed This Visit      Other   Vitamin D deficiency    Patient doing well on supplemental vitamin d Check level.      Relevant Medications   Vitamin D, Ergocalciferol, (DRISDOL) 1.25 MG (50000 UNIT) CAPS capsule   Other Relevant Orders   Vitamin D, 25-hydroxy   Obesity (BMI 30.0-34.9)    Other Visit Diagnoses    Routine general medical examination at a health care facility    -  Primary   Relevant Orders   CBC with Differential   Comprehensive metabolic panel   Lipid Panel   Hemoglobin A1c   TSH      Meds ordered this encounter  Medications  . Vitamin D, Ergocalciferol, (DRISDOL) 1.25 MG (50000 UNIT) CAPS capsule    Sig: Take 1 capsule (50,000 Units total) by mouth every 7 (seven) days.    Dispense:  12 capsule    Refill:  3    Follow-up: Return if symptoms worsen or fail to improve.    Reinaldo Meeker, MD

## 2019-11-15 NOTE — Assessment & Plan Note (Signed)
Patient doing well on supplemental vitamin d Check level.

## 2019-11-16 ENCOUNTER — Other Ambulatory Visit: Payer: Self-pay | Admitting: Legal Medicine

## 2019-11-16 DIAGNOSIS — Z20828 Contact with and (suspected) exposure to other viral communicable diseases: Secondary | ICD-10-CM | POA: Diagnosis not present

## 2019-11-16 DIAGNOSIS — Z0000 Encounter for general adult medical examination without abnormal findings: Secondary | ICD-10-CM | POA: Diagnosis not present

## 2019-11-16 DIAGNOSIS — E559 Vitamin D deficiency, unspecified: Secondary | ICD-10-CM | POA: Diagnosis not present

## 2019-11-17 LAB — SAR COV2 SEROLOGY (COVID19)AB(IGG),IA: DiaSorin SARS-CoV-2 Ab, IgG: POSITIVE

## 2019-11-19 LAB — CBC WITH DIFFERENTIAL/PLATELET
Basophils Absolute: 0.1 10*3/uL (ref 0.0–0.2)
Basos: 1 %
EOS (ABSOLUTE): 0.4 10*3/uL (ref 0.0–0.4)
Eos: 4 %
Hematocrit: 41.4 % (ref 34.0–46.6)
Hemoglobin: 13.4 g/dL (ref 11.1–15.9)
Immature Grans (Abs): 0 10*3/uL (ref 0.0–0.1)
Immature Granulocytes: 0 %
Lymphocytes Absolute: 4 10*3/uL — ABNORMAL HIGH (ref 0.7–3.1)
Lymphs: 43 %
MCH: 28.7 pg (ref 26.6–33.0)
MCHC: 32.4 g/dL (ref 31.5–35.7)
MCV: 89 fL (ref 79–97)
Monocytes Absolute: 0.6 10*3/uL (ref 0.1–0.9)
Monocytes: 6 %
Neutrophils Absolute: 4.3 10*3/uL (ref 1.4–7.0)
Neutrophils: 46 %
Platelets: 316 10*3/uL (ref 150–450)
RBC: 4.67 x10E6/uL (ref 3.77–5.28)
RDW: 12.2 % (ref 11.7–15.4)
WBC: 9.3 10*3/uL (ref 3.4–10.8)

## 2019-11-19 LAB — COMPREHENSIVE METABOLIC PANEL
ALT: 16 IU/L (ref 0–32)
AST: 20 IU/L (ref 0–40)
Albumin/Globulin Ratio: 1.7 (ref 1.2–2.2)
Albumin: 4.3 g/dL (ref 3.8–4.8)
Alkaline Phosphatase: 89 IU/L (ref 39–117)
BUN/Creatinine Ratio: 9 (ref 9–23)
BUN: 8 mg/dL (ref 6–24)
Bilirubin Total: 0.5 mg/dL (ref 0.0–1.2)
CO2: 20 mmol/L (ref 20–29)
Calcium: 9.4 mg/dL (ref 8.7–10.2)
Chloride: 106 mmol/L (ref 96–106)
Creatinine, Ser: 0.86 mg/dL (ref 0.57–1.00)
GFR calc Af Amer: 96 mL/min/{1.73_m2} (ref >59)
GFR calc non Af Amer: 83 mL/min/{1.73_m2} (ref >59)
Globulin, Total: 2.6 g/dL (ref 1.5–4.5)
Glucose: 80 mg/dL (ref 65–99)
Potassium: 4 mmol/L (ref 3.5–5.2)
Sodium: 141 mmol/L (ref 134–144)
Total Protein: 6.9 g/dL (ref 6.0–8.5)

## 2019-11-19 LAB — LIPID PANEL
Chol/HDL Ratio: 3.8 ratio (ref 0.0–4.4)
Cholesterol, Total: 167 mg/dL (ref 100–199)
HDL: 44 mg/dL (ref >39)
LDL Chol Calc (NIH): 109 mg/dL — ABNORMAL HIGH (ref 0–99)
Triglycerides: 71 mg/dL (ref 0–149)
VLDL Cholesterol Cal: 14 mg/dL (ref 5–40)

## 2019-11-19 LAB — HEMOGLOBIN A1C
Est. average glucose Bld gHb Est-mCnc: 108 mg/dL
Hgb A1c MFr Bld: 5.4 % (ref 4.8–5.6)

## 2019-11-19 LAB — VITAMIN D 25 HYDROXY (VIT D DEFICIENCY, FRACTURES): Vit D, 25-Hydroxy: 32.1 ng/mL (ref 30.0–100.0)

## 2019-11-19 LAB — CARDIOVASCULAR RISK ASSESSMENT

## 2019-11-19 LAB — TSH: TSH: 1.7 u[IU]/mL (ref 0.450–4.500)

## 2019-11-19 NOTE — Progress Notes (Signed)
CBC ok, Kidney and liver tests normal, LDL 109 low cholesterol diet lp

## 2019-11-21 NOTE — Progress Notes (Signed)
Positive COVID antibodies lp

## 2019-12-04 ENCOUNTER — Encounter: Payer: Self-pay | Admitting: Legal Medicine

## 2019-12-04 ENCOUNTER — Ambulatory Visit (INDEPENDENT_AMBULATORY_CARE_PROVIDER_SITE_OTHER): Payer: 59 | Admitting: Legal Medicine

## 2019-12-04 ENCOUNTER — Other Ambulatory Visit: Payer: Self-pay

## 2019-12-04 VITALS — BP 122/68 | HR 63 | Temp 96.4°F | Resp 16 | Ht 63.0 in | Wt 192.4 lb

## 2019-12-04 DIAGNOSIS — E669 Obesity, unspecified: Secondary | ICD-10-CM | POA: Diagnosis not present

## 2019-12-04 DIAGNOSIS — B351 Tinea unguium: Secondary | ICD-10-CM | POA: Diagnosis not present

## 2019-12-04 DIAGNOSIS — S0300XA Dislocation of jaw, unspecified side, initial encounter: Secondary | ICD-10-CM | POA: Diagnosis not present

## 2019-12-04 MED ORDER — ALPRAZOLAM 0.5 MG PO TBDP: 0.5000 mg | ORAL_TABLET | Freq: Every evening | ORAL | 2 refills | Status: DC | PRN

## 2019-12-04 MED ORDER — TERBINAFINE HCL 250 MG PO TABS: 250.0000 mg | ORAL_TABLET | Freq: Every day | ORAL | 3 refills | Status: DC

## 2019-12-04 NOTE — Assessment & Plan Note (Signed)
Positive KOH for hyphae.  Started on lamisil orally for 3 months.

## 2019-12-04 NOTE — Progress Notes (Signed)
Established Patient Office Visit  Subjective:  Patient ID: [name], female    DOB: 1976-05-28  Age: 44 y.o. MRN: [id number]  CC:  Chief Complaint  Patient presents with  . Temporomandibular Joint Pain  . Toe nails problems  . Obesity    HPI [name] presents for follow up.  Chronic TMJ, she has seen dentists and tried bite block at night.  She is tense during sleep.  Sleep is interrupted.  She has onychomycosis nails of both feet.  She wishes medicine.  Counseled on detrimental effects of excessive weight on overall health. Counseled to engage in 150 minutes of moderate exercise a week and to reduce caloric intake with a diet of healthy foods that she feels she can be most compliant with. Set goal to reach a weight of 20 pounds in the next 3 months months. She wants to try saxenda.. She is swimming and cutting portion.  Past Medical History:  Diagnosis Date  . AMA (advanced maternal age) multigravida 57+   . Medical history non-contributory     Past Surgical History:  Procedure Laterality Date  . CESAREAN SECTION  2016  . NO PAST SURGERIES      Family History  Problem Relation Age of Onset  . Diabetes Mother   . High blood pressure Mother   . Diabetes Father   . High blood pressure Father   . Diabetes Paternal Grandmother   . Diabetes Paternal Grandfather   . High blood pressure Sister     Social History   Socioeconomic History  . Marital status: Single    Spouse name: Not on file  . Number of children: 1  . Years of education: Not on file  . Highest education level: Bachelor's degree (e.g., BA, AB, BS)  Occupational History  . Occupation: Psychologist, sport and exercise  Tobacco Use  . Smoking status: Never Smoker  . Smokeless tobacco: Never Used  Substance and Sexual Activity  . Alcohol use: Yes    Alcohol/week: 2.0 standard drinks    Types: 2 Cans of beer per week    Comment: BEER - WEEKENDS  . Drug use: Never  . Sexual activity: Yes   Comment: 1ST INTERCOURSE- 3, PARTNERS- 7  Other Topics Concern  . Not on file  Social History Narrative  . Not on file   Social Determinants of Health   Financial Resource Strain:   . Difficulty of Paying Living Expenses:   Food Insecurity:   . Worried About Charity fundraiser in the Last Year:   . Arboriculturist in the Last Year:   Transportation Needs:   . Film/video editor (Medical):   Marland Kitchen Lack of Transportation (Non-Medical):   Physical Activity:   . Days of Exercise per Week:   . Minutes of Exercise per Session:   Stress:   . Feeling of Stress :   Social Connections:   . Frequency of Communication with Friends and Family:   . Frequency of Social Gatherings with Friends and Family:   . Attends Religious Services:   . Active Member of Clubs or Organizations:   . Attends Archivist Meetings:   Marland Kitchen Marital Status:   Intimate Partner Violence:   . Fear of Current or Ex-Partner:   . Emotionally Abused:   Marland Kitchen Physically Abused:   . Sexually Abused:     Outpatient Medications Prior to Visit  Medication Sig Dispense Refill  . levocetirizine (XYZAL) 5 MG tablet Take 5 mg  by mouth every evening.    Marland Kitchen omeprazole (PRILOSEC) 40 MG capsule Take 1 capsule by mouth daily.    . Vitamin D, Ergocalciferol, (DRISDOL) 1.25 MG (50000 UNIT) CAPS capsule Take 1 capsule (50,000 Units total) by mouth every 7 (seven) days. 12 capsule 3   No facility-administered medications prior to visit.    No Known Allergies  ROS Review of Systems  Constitutional: Negative.   HENT: Negative.   Eyes: Negative.   Respiratory: Negative.   Cardiovascular: Negative.   Gastrointestinal: Negative.   Endocrine: Negative.   Genitourinary: Negative.   Musculoskeletal: Negative.   Skin: Negative.   Neurological: Negative.       Objective:    Physical Exam  Constitutional: She is oriented to person, place, and time. She appears well-developed and well-nourished.  HENT:  Head:  Normocephalic.    Mouth/Throat: Uvula is midline, oropharynx is clear and moist and mucous membranes are normal.  Dislocating left TMJ and pain in right TMJ.  Eyes: Pupils are equal, round, and reactive to light. Conjunctivae and EOM are normal.  Cardiovascular: Normal rate, regular rhythm and normal heart sounds.  Pulmonary/Chest: Effort normal and breath sounds normal.  Abdominal: Soft. Bowel sounds are normal.  Musculoskeletal:        General: Normal range of motion.     Cervical back: Normal range of motion.  Neurological: She is alert and oriented to person, place, and time. She has normal reflexes.  Vitals reviewed. KOH- fungal elements present, scraping from under great toenail  BP 122/68 (BP Location: Left Arm, Patient Position: Sitting)   Pulse 63   Temp (!) 96.4 F (35.8 C) (Temporal)   Resp 16   Ht 5' 3"  (1.6 m)   Wt 192 lb 6.4 oz (87.3 kg)   BMI 34.08 kg/m  Wt Readings from Last 3 Encounters:  12/04/19 192 lb 6.4 oz (87.3 kg)  11/15/19 190 lb 9.6 oz (86.5 kg)  09/04/19 186 lb (84.4 kg)     Health Maintenance Due  Topic Date Due  . TETANUS/TDAP  Never done    There are no preventive care reminders to display for this patient.  Lab Results  Component Value Date   TSH 1.700 11/16/2019   Lab Results  Component Value Date   WBC 9.3 11/16/2019   HGB 13.4 11/16/2019   HCT 41.4 11/16/2019   MCV 89 11/16/2019   PLT 316 11/16/2019   Lab Results  Component Value Date   NA 141 11/16/2019   K 4.0 11/16/2019   CO2 20 11/16/2019   GLUCOSE 80 11/16/2019   BUN 8 11/16/2019   CREATININE 0.86 11/16/2019   BILITOT 0.5 11/16/2019   ALKPHOS 89 11/16/2019   AST 20 11/16/2019   ALT 16 11/16/2019   PROT 6.9 11/16/2019   ALBUMIN 4.3 11/16/2019   CALCIUM 9.4 11/16/2019   Lab Results  Component Value Date   CHOL 167 11/16/2019   Lab Results  Component Value Date   HDL 44 11/16/2019   Lab Results  Component Value Date   LDLCALC 109 (H) 11/16/2019   Lab  Results  Component Value Date   TRIG 71 11/16/2019   Lab Results  Component Value Date   CHOLHDL 3.8 11/16/2019   Lab Results  Component Value Date   HGBA1C 5.4 11/16/2019      Assessment & Plan:   Problem List Items Addressed This Visit      Musculoskeletal and Integument   Onychomycosis - Primary    Positive  KOH for hyphae.  Started on lamisil orally for 3 months.      Relevant Medications   terbinafine (LAMISIL) 250 MG tablet   Other Relevant Orders   POCT Skin KOH   TMJ (dislocation of temporomandibular joint)    Chronic problem with TMJ, she saw dentist and has oral retainer but doesn't use .  She has pain in jaw with eating.  Try alprazolam to get deep sleep and less grinding teeth.      Relevant Medications   ALPRAZolam (NIRAVAM) 0.5 MG dissolvable tablet     Other   Obesity (BMI 30.0-34.9)    An individualize plan was formulated using patient history and physical exam to encourage weight loss.  An evidence based program was formulated.  Patient is to cut portion size with meals and to plan physical exercise 3 days a week at least 20 minutes.  Weight watchers and other programs are helpful.  Planned amount of weight loss 10 lbs. Try saxenda.  Meds ordered this encounter  Medications  . ALPRAZolam (NIRAVAM) 0.5 MG dissolvable tablet    Sig: Take 1 tablet (0.5 mg total) by mouth at bedtime as needed for anxiety.    Dispense:  30 tablet    Refill:  2  . terbinafine (LAMISIL) 250 MG tablet    Sig: Take 1 tablet (250 mg total) by mouth daily.    Dispense:  30 tablet    Refill:  3    Follow-up: Return in about 1 month (around 01/04/2020), or for weigh in.    Reinaldo Meeker, MD

## 2019-12-04 NOTE — Assessment & Plan Note (Signed)
Chronic problem with TMJ, she saw dentist and has oral retainer but doesn't use .  She has pain in jaw with eating.  Try alprazolam to get deep sleep and less grinding teeth.

## 2019-12-05 LAB — POCT SKIN KOH: Skin KOH, POC: POSITIVE — AB

## 2019-12-12 ENCOUNTER — Other Ambulatory Visit: Payer: Self-pay | Admitting: Women's Health

## 2019-12-12 MED ORDER — NORELGESTROMIN-ETH ESTRADIOL 150-35 MCG/24HR TD PTWK: MEDICATED_PATCH | TRANSDERMAL | 3 refills | Status: DC

## 2019-12-18 ENCOUNTER — Other Ambulatory Visit: Payer: Self-pay | Admitting: Legal Medicine

## 2019-12-18 MED ORDER — SAXENDA 18 MG/3ML ~~LOC~~ SOPN: 1.8000 mg | PEN_INJECTOR | Freq: Every day | SUBCUTANEOUS | 5 refills | Status: DC

## 2019-12-24 ENCOUNTER — Other Ambulatory Visit: Payer: Self-pay

## 2019-12-24 MED ORDER — SAXENDA 18 MG/3ML ~~LOC~~ SOPN: 1.8000 mg | PEN_INJECTOR | Freq: Every day | SUBCUTANEOUS | 5 refills | Status: DC

## 2020-01-17 ENCOUNTER — Other Ambulatory Visit: Payer: Self-pay

## 2020-01-17 ENCOUNTER — Other Ambulatory Visit: Payer: Self-pay | Admitting: Legal Medicine

## 2020-01-17 MED ORDER — ERYTHROMYCIN BASE 500 MG PO TBEC: 500.0000 mg | DELAYED_RELEASE_TABLET | Freq: Four times a day (QID) | ORAL | 0 refills | Status: DC

## 2020-01-17 MED ORDER — AZITHROMYCIN 500 MG PO TABS: 500.0000 mg | ORAL_TABLET | Freq: Every day | ORAL | 0 refills | Status: DC

## 2020-03-01 ENCOUNTER — Ambulatory Visit: Payer: 59 | Attending: Internal Medicine

## 2020-06-03 ENCOUNTER — Ambulatory Visit (INDEPENDENT_AMBULATORY_CARE_PROVIDER_SITE_OTHER): Payer: 59 | Admitting: Family Medicine

## 2020-06-03 ENCOUNTER — Other Ambulatory Visit: Payer: Self-pay

## 2020-06-03 VITALS — BP 108/72 | HR 108 | Temp 97.5°F | Resp 16

## 2020-06-03 DIAGNOSIS — L237 Allergic contact dermatitis due to plants, except food: Secondary | ICD-10-CM

## 2020-06-03 MED ORDER — PREDNISONE 10 MG PO TABS: ORAL_TABLET | ORAL | 0 refills | Status: AC

## 2020-06-03 NOTE — Progress Notes (Signed)
Acute Office Visit  Subjective:    Patient ID: [name], female    DOB: [birth year], 44 y.o.   MRN: [id number]  Chief Complaint  Patient presents with  . Rash    right arm    HPI Patient is in today for poison ivy on right arm. Itchy. Worsening over the last 2 days.  Patient has tried various creams without significant resolution.  Has tried antihistamines.  Past Medical History:  Diagnosis Date  . AMA (advanced maternal age) multigravida 78+   . Medical history non-contributory     Past Surgical History:  Procedure Laterality Date  . CESAREAN SECTION  2016  . NO PAST SURGERIES      Family History  Problem Relation Age of Onset  . Diabetes Mother   . High blood pressure Mother   . Diabetes Father   . High blood pressure Father   . Diabetes Paternal Grandmother   . Diabetes Paternal Grandfather   . High blood pressure Sister     Social History   Socioeconomic History  . Marital status: Single    Spouse name: Not on file  . Number of children: 1  . Years of education: Not on file  . Highest education level: Bachelor's degree (e.g., BA, AB, BS)  Occupational History  . Occupation: Psychologist, sport and exercise  Tobacco Use  . Smoking status: Never Smoker  . Smokeless tobacco: Never Used  Vaping Use  . Vaping Use: Never used  Substance and Sexual Activity  . Alcohol use: Yes    Alcohol/week: 2.0 standard drinks    Types: 2 Cans of beer per week    Comment: BEER - WEEKENDS  . Drug use: Never  . Sexual activity: Yes    Comment: 1ST INTERCOURSE- 59, PARTNERS- 7  Other Topics Concern  . Not on file  Social History Narrative  . Not on file   Social Determinants of Health   Financial Resource Strain:   . Difficulty of Paying Living Expenses: Not on file  Food Insecurity:   . Worried About Charity fundraiser in the Last Year: Not on file  . Ran Out of Food in the Last Year: Not on file  Transportation Needs:   . Lack of Transportation (Medical): Not on  file  . Lack of Transportation (Non-Medical): Not on file  Physical Activity:   . Days of Exercise per Week: Not on file  . Minutes of Exercise per Session: Not on file  Stress:   . Feeling of Stress : Not on file  Social Connections:   . Frequency of Communication with Friends and Family: Not on file  . Frequency of Social Gatherings with Friends and Family: Not on file  . Attends Religious Services: Not on file  . Active Member of Clubs or Organizations: Not on file  . Attends Archivist Meetings: Not on file  . Marital Status: Not on file  Intimate Partner Violence:   . Fear of Current or Ex-Partner: Not on file  . Emotionally Abused: Not on file  . Physically Abused: Not on file  . Sexually Abused: Not on file    Outpatient Medications Prior to Visit  Medication Sig Dispense Refill  . ALPRAZolam (NIRAVAM) 0.5 MG dissolvable tablet Take 1 tablet (0.5 mg total) by mouth at bedtime as needed for anxiety. 30 tablet 2  . levocetirizine (XYZAL) 5 MG tablet Take 5 mg by mouth every evening.    . norelgestromin-ethinyl estradiol (ORTHO EVRA) 150-35 MCG/24HR  transdermal patch 1 patch per week, place on lower abd, hip, never on breasts on Sunday after cycle starts. 9 patch 3  . terbinafine (LAMISIL) 250 MG tablet Take 1 tablet (250 mg total) by mouth daily. 30 tablet 3  . Vitamin D, Ergocalciferol, (DRISDOL) 1.25 MG (50000 UNIT) CAPS capsule Take 1 capsule (50,000 Units total) by mouth every 7 (seven) days. 12 capsule 3  . azithromycin (ZITHROMAX) 500 MG tablet Take 1 tablet (500 mg total) by mouth daily. Take 500 mg a day PO for 5 days. 5 tablet 0  . Liraglutide -Weight Management (SAXENDA) 18 MG/3ML SOPN Inject 0.3 mLs (1.8 mg total) into the skin daily. 9 mL 5  . omeprazole (PRILOSEC) 40 MG capsule Take 1 capsule by mouth daily.     No facility-administered medications prior to visit.    No Known Allergies  Review of Systems  Constitutional: Negative for fever.        Objective:    Physical Exam Vitals reviewed.  Constitutional:      Appearance: Normal appearance.  Skin:    Findings: Rash (right arm - vesicles in lines. excoriations. on rightarm.) present.  Neurological:     Mental Status: She is alert.     BP 108/72   Pulse (!) 108   Temp (!) 97.5 F (36.4 C)   Resp 16  Wt Readings from Last 3 Encounters:  12/04/19 192 lb 6.4 oz (87.3 kg)  11/15/19 190 lb 9.6 oz (86.5 kg)  09/04/19 186 lb (84.4 kg)    Health Maintenance Due  Topic Date Due  . Hepatitis C Screening  Never done  . TETANUS/TDAP  Never done  . INFLUENZA VACCINE  04/06/2020    There are no preventive care reminders to display for this patient.   Lab Results  Component Value Date   TSH 1.700 11/16/2019   Lab Results  Component Value Date   WBC 9.3 11/16/2019   HGB 13.4 11/16/2019   HCT 41.4 11/16/2019   MCV 89 11/16/2019   PLT 316 11/16/2019   Lab Results  Component Value Date   NA 141 11/16/2019   K 4.0 11/16/2019   CO2 20 11/16/2019   GLUCOSE 80 11/16/2019   BUN 8 11/16/2019   CREATININE 0.86 11/16/2019   BILITOT 0.5 11/16/2019   ALKPHOS 89 11/16/2019   AST 20 11/16/2019   ALT 16 11/16/2019   PROT 6.9 11/16/2019   ALBUMIN 4.3 11/16/2019   CALCIUM 9.4 11/16/2019   Lab Results  Component Value Date   CHOL 167 11/16/2019   Lab Results  Component Value Date   HDL 44 11/16/2019   Lab Results  Component Value Date   LDLCALC 109 (H) 11/16/2019   Lab Results  Component Value Date   TRIG 71 11/16/2019   Lab Results  Component Value Date   CHOLHDL 3.8 11/16/2019   Lab Results  Component Value Date   HGBA1C 5.4 11/16/2019       Assessment & Plan:  1. Poison ivy dermatitis  Start on prednisone.  May take antihistamines also. Meds ordered this encounter  Medications  . predniSONE (DELTASONE) 10 MG tablet    Sig: Take 6 tablets (60 mg total) by mouth daily with breakfast for 1 day, THEN 5 tablets (50 mg total) daily with breakfast for  1 day, THEN 4 tablets (40 mg total) daily with breakfast for 1 day, THEN 3 tablets (30 mg total) daily with breakfast for 1 day, THEN 2 tablets (20 mg total)  daily with breakfast for 1 day, THEN 1 tablet (10 mg total) daily with breakfast for 1 day.    Dispense:  21 tablet    Refill:  0    No orders of the defined types were placed in this encounter.    Follow-up: No follow-ups on file.  An After Visit Summary was printed and given to the patient.  Rochel Brome [facility] [phone]

## 2020-06-08 ENCOUNTER — Encounter: Payer: Self-pay | Admitting: Family Medicine

## 2020-06-30 ENCOUNTER — Ambulatory Visit (INDEPENDENT_AMBULATORY_CARE_PROVIDER_SITE_OTHER): Payer: 59

## 2020-06-30 ENCOUNTER — Encounter: Payer: Self-pay | Admitting: Family Medicine

## 2020-06-30 DIAGNOSIS — Z23 Encounter for immunization: Secondary | ICD-10-CM

## 2020-07-08 ENCOUNTER — Telehealth: Payer: Self-pay | Admitting: Family Medicine

## 2020-07-08 ENCOUNTER — Telehealth (INDEPENDENT_AMBULATORY_CARE_PROVIDER_SITE_OTHER): Payer: 59 | Admitting: Family Medicine

## 2020-07-08 DIAGNOSIS — J069 Acute upper respiratory infection, unspecified: Secondary | ICD-10-CM

## 2020-07-08 DIAGNOSIS — Z20822 Contact with and (suspected) exposure to covid-19: Secondary | ICD-10-CM

## 2020-07-08 LAB — POC COVID19 BINAXNOW: SARS Coronavirus 2 Ag: NEGATIVE

## 2020-07-08 LAB — POCT RAPID STREP A (OFFICE): Rapid Strep A Screen: NEGATIVE

## 2020-07-08 MED ORDER — AZITHROMYCIN 500 MG PO TABS: ORAL_TABLET | ORAL | 0 refills | Status: DC

## 2020-07-08 NOTE — Progress Notes (Signed)
Virtual Visit via Telephone Note   This visit type was conducted due to national recommendations for restrictions regarding the COVID-19 Pandemic (e.g. social distancing) in an effort to limit this patient's exposure and mitigate transmission in our community.  Due to her co-morbid illnesses, this patient is at least at moderate risk for complications without adequate follow up.  This format is felt to be most appropriate for this patient at this time.  The patient did not have access to video technology/had technical difficulties with video requiring transitioning to audio format only (telephone).  All issues noted in this document were discussed and addressed.  No physical exam could be performed with this format.  Patient verbally consented to a telehealth visit.   Date:  07/08/2020   ID:  [name], DOB 13-Feb-1976, MRN [id number]  Patient Location: Home Provider Location: Office/Clinic  PCP:  Lillard Anes, MD   Evaluation Performed: acute  Chief Complaint: cough  History of Present Illness:    [name] is a 44 y.o. female with Nasal congestion, sore throat, and cough since yesterday. No fever. Some chills,  No achy. No earaches.  No sob, chest pain. Sense of taste and smell are intact.  Taking mucinex and norel AD. Tylenol.  Rapid Covid 19 test and Strep test The patient does have symptoms concerning for COVID-19 infection (fever, chills, cough, or new shortness of breath).    Past Medical History:  Diagnosis Date   AMA (advanced maternal age) multigravida 48+    Medical history non-contributory     Past Surgical History:  Procedure Laterality Date   CESAREAN SECTION  2016   NO PAST SURGERIES      Family History  Problem Relation Age of Onset   Diabetes Mother    High blood pressure Mother    Diabetes Father    High blood pressure Father    Diabetes Paternal Grandmother    Diabetes Paternal Grandfather    High blood pressure  Sister     Social History   Socioeconomic History   Marital status: Single    Spouse name: Not on file   Number of children: 1   Years of education: Not on file   Highest education level: Bachelor's degree (e.g., BA, AB, BS)  Occupational History   Occupation: Psychologist, sport and exercise  Tobacco Use   Smoking status: Never Smoker   Smokeless tobacco: Never Used  Vaping Use   Vaping Use: Never used  Substance and Sexual Activity   Alcohol use: Yes    Alcohol/week: 2.0 standard drinks    Types: 2 Cans of beer per week    Comment: BEER - WEEKENDS   Drug use: Never   Sexual activity: Yes    Comment: 1ST INTERCOURSE- 1, PARTNERS- 7  Other Topics Concern   Not on file  Social History Narrative   Not on file   Social Determinants of Health   Financial Resource Strain:    Difficulty of Paying Living Expenses: Not on file  Food Insecurity:    Worried About Charity fundraiser in the Last Year: Not on file   [employer] of Food in the Last Year: Not on file  Transportation Needs:    Lack of Transportation (Medical): Not on file   Lack of Transportation (Non-Medical): Not on file  Physical Activity:    Days of Exercise per Week: Not on file   Minutes of Exercise per Session: Not on file  Stress:    Feeling  of Stress : Not on file  Social Connections:    Frequency of Communication with Friends and Family: Not on file   Frequency of Social Gatherings with Friends and Family: Not on file   Attends Religious Services: Not on file   Active Member of Clubs or Organizations: Not on file   Attends Archivist Meetings: Not on file   Marital Status: Not on file  Intimate Partner Violence:    Fear of Current or Ex-Partner: Not on file   Emotionally Abused: Not on file   Physically Abused: Not on file   Sexually Abused: Not on file    Outpatient Medications Prior to Visit  Medication Sig Dispense Refill   ALPRAZolam (NIRAVAM) 0.5 MG dissolvable  tablet Take 1 tablet (0.5 mg total) by mouth at bedtime as needed for anxiety. 30 tablet 2   levocetirizine (XYZAL) 5 MG tablet Take 5 mg by mouth every evening.     norelgestromin-ethinyl estradiol (ORTHO EVRA) 150-35 MCG/24HR transdermal patch 1 patch per week, place on lower abd, hip, never on breasts on Sunday after cycle starts. 9 patch 3   terbinafine (LAMISIL) 250 MG tablet Take 1 tablet (250 mg total) by mouth daily. 30 tablet 3   Vitamin D, Ergocalciferol, (DRISDOL) 1.25 MG (50000 UNIT) CAPS capsule Take 1 capsule (50,000 Units total) by mouth every 7 (seven) days. 12 capsule 3   No facility-administered medications prior to visit.    Allergies:   Patient has no known allergies.   Social History   Tobacco Use   Smoking status: Never Smoker   Smokeless tobacco: Never Used  Vaping Use   Vaping Use: Never used  Substance Use Topics   Alcohol use: Yes    Alcohol/week: 2.0 standard drinks    Types: 2 Cans of beer per week    Comment: BEER - WEEKENDS   Drug use: Never     Review of Systems  Constitutional: Positive for chills. Negative for fever and malaise/fatigue.  HENT: Positive for congestion and sore throat. Negative for ear pain and sinus pain.   Respiratory: Positive for cough. Negative for shortness of breath.   Cardiovascular: Negative for chest pain.  Musculoskeletal: Negative for myalgias.  Neurological: Negative for headaches.     Labs/Other Tests and Data Reviewed:    Recent Labs: 11/16/2019: ALT 16; BUN 8; Creatinine, Ser 0.86; Hemoglobin 13.4; Platelets 316; Potassium 4.0; Sodium 141; TSH 1.700   Recent Lipid Panel Lab Results  Component Value Date/Time   CHOL 167 11/16/2019 07:49 AM   TRIG 71 11/16/2019 07:49 AM   HDL 44 11/16/2019 07:49 AM   CHOLHDL 3.8 11/16/2019 07:49 AM   LDLCALC 109 (H) 11/16/2019 07:49 AM    Wt Readings from Last 3 Encounters:  12/04/19 192 lb 6.4 oz (87.3 kg)  11/15/19 190 lb 9.6 oz (86.5 kg)  09/04/19 186 lb  (84.4 kg)     Objective:    Vital Signs:  There were no vitals taken for this visit.   Physical Exam Neurological:     Mental Status: She is alert.   Sounds hoarse and congested. Coughing while on phone.   ASSESSMENT & PLAN:   1. URI, acute - POCT rapid strep A negative - POC COVID-19 BinaxNow negative -Z-Pak sent Orders Placed This Encounter  Procedures   POCT rapid strep A   POC COVID-19 BinaxNow     No orders of the defined types were placed in this encounter.   COVID-19 Education: The signs and  symptoms of COVID-19 were discussed with the patient and how to seek care for testing (follow up with PCP or arrange E-visit). The importance of social distancing was discussed today.  Time:   Today, I have spent 10 minutes with the patient with telehealth technology discussing the above problems.    Follow Up:  Virtual Visit  prn  Signed, Rochel Brome, MD  07/08/2020 1:45 PM    [place]

## 2020-07-08 NOTE — Telephone Encounter (Signed)
Nasal congestion and cough since yesterday. Sore throat. No sinus tenderness. No fever. Some chills,  No achy. No earaches.  No sob, chest pain. Sense of taste and smell are intact.  Taking mucinex and norel AD. Tylenol.  Rapid Covid 19 test and Strep test.

## 2020-07-14 ENCOUNTER — Encounter: Payer: Self-pay | Admitting: Family Medicine

## 2020-08-10 ENCOUNTER — Telehealth: Payer: Self-pay | Admitting: Family Medicine

## 2020-08-10 NOTE — Telephone Encounter (Signed)
Patient called complaining of nasal congestion, sore throat, coughing began 2 days ago, and now has lost her sense of smell. No known exposures to covid 19. Went to [employer] on Friday. Pt wore a mask, but others did not. Her daughter began having symptoms on Thursday.  No fever, chills, sweats. Achy. No sinus tenderness. No malaise.   VS this weekend. Pulse feels normal.   Come in am for rapid covid testing and pcr testing.  Televisit completed.  Pt to call and get her daughter an appointment with pediatrician and then call Kim to set up time to come in. Prefer in the morning. Dr. Tobie Poet

## 2020-08-10 NOTE — Progress Notes (Addendum)
Virtual Visit via Telephone Note   This visit type was conducted due to national recommendations for restrictions regarding the COVID-19 Pandemic (e.g. social distancing) in an effort to limit this patient's exposure and mitigate transmission in our community.  Due to her co-morbid illnesses, this patient is at least at moderate risk for complications without adequate follow up.  This format is felt to be most appropriate for this patient at this time.  The patient did not have access to video technology/had technical difficulties with video requiring transitioning to audio format only (telephone).  All issues noted in this document were discussed and addressed.  No physical exam could be performed with this format.  Patient verbally consented to a telehealth visit.   Date:  08/10/2020   ID:  [name], DOB Feb 13, 1976, MRN [id number]  Patient Location: Home Provider Location: Office/Clinic  PCP: Rochel Brome, MD Evaluation Performed: acute visit Chief Complaint:  cough  History of Present Illness:    [name] is a 44 y.o. female  Patient called complaining of nasal congestion, sore throat, coughing began 2 days ago, and now has lost her sense of smell. No known exposures to covid 19. Went to [employer] on Friday. Pt wore a mask, but others did not. Her daughter began having symptoms on Thursday. No fever, chills, sweats, [place]. No sinus tenderness. No malaise.  The patient does have symptoms concerning for COVID-19 infection (fever, chills, cough, or new shortness of breath).    Past Medical History:  Diagnosis Date  . AMA (advanced maternal age) multigravida 63+   . Medical history non-contributory     Past Surgical History:  Procedure Laterality Date  . CESAREAN SECTION  2016  . NO PAST SURGERIES      Family History  Problem Relation Age of Onset  . Diabetes Mother   . High blood pressure Mother   . Diabetes Father   . High blood pressure Father   .  Diabetes Paternal Grandmother   . Diabetes Paternal Grandfather   . High blood pressure Sister     Social History   Socioeconomic History  . Marital status: Single    Spouse name: Not on file  . Number of children: 1  . Years of education: Not on file  . Highest education level: Bachelor's degree (e.g., BA, AB, BS)  Occupational History  . Occupation: Psychologist, sport and exercise  Tobacco Use  . Smoking status: Never Smoker  . Smokeless tobacco: Never Used  Vaping Use  . Vaping Use: Never used  Substance and Sexual Activity  . Alcohol use: Yes    Alcohol/week: 2.0 standard drinks    Types: 2 Cans of beer per week    Comment: BEER - WEEKENDS  . Drug use: Never  . Sexual activity: Yes    Comment: 1ST INTERCOURSE- 59, PARTNERS- 7  Other Topics Concern  . Not on file  Social History Narrative  . Not on file   Social Determinants of Health   Financial Resource Strain:   . Difficulty of Paying Living Expenses: Not on file  Food Insecurity:   . Worried About Charity fundraiser in the Last Year: Not on file  . Ran Out of Food in the Last Year: Not on file  Transportation Needs:   . Lack of Transportation (Medical): Not on file  . Lack of Transportation (Non-Medical): Not on file  Physical Activity:   . Days of Exercise per Week: Not on file  . Minutes  of Exercise per Session: Not on file  Stress:   . Feeling of Stress : Not on file  Social Connections:   . Frequency of Communication with Friends and Family: Not on file  . Frequency of Social Gatherings with Friends and Family: Not on file  . Attends Religious Services: Not on file  . Active Member of Clubs or Organizations: Not on file  . Attends Archivist Meetings: Not on file  . Marital Status: Not on file  Intimate Partner Violence:   . Fear of Current or Ex-Partner: Not on file  . Emotionally Abused: Not on file  . Physically Abused: Not on file  . Sexually Abused: Not on file    Outpatient Medications Prior  to Visit  Medication Sig Dispense Refill  . ALPRAZolam (NIRAVAM) 0.5 MG dissolvable tablet Take 1 tablet (0.5 mg total) by mouth at bedtime as needed for anxiety. 30 tablet 2  . azithromycin (ZITHROMAX) 500 MG tablet Once daily x 3 days 3 tablet 0  . levocetirizine (XYZAL) 5 MG tablet Take 5 mg by mouth every evening.    . norelgestromin-ethinyl estradiol (ORTHO EVRA) 150-35 MCG/24HR transdermal patch 1 patch per week, place on lower abd, hip, never on breasts on Sunday after cycle starts. 9 patch 3  . terbinafine (LAMISIL) 250 MG tablet Take 1 tablet (250 mg total) by mouth daily. 30 tablet 3  . Vitamin D, Ergocalciferol, (DRISDOL) 1.25 MG (50000 UNIT) CAPS capsule Take 1 capsule (50,000 Units total) by mouth every 7 (seven) days. 12 capsule 3   No facility-administered medications prior to visit.    Allergies:   Patient has no known allergies.   Social History   Tobacco Use  . Smoking status: Never Smoker  . Smokeless tobacco: Never Used  Vaping Use  . Vaping Use: Never used  Substance Use Topics  . Alcohol use: Yes    Alcohol/week: 2.0 standard drinks    Types: 2 Cans of beer per week    Comment: BEER - WEEKENDS  . Drug use: Never     Review of Systems  Constitutional: Negative for chills, fever and malaise/fatigue.  HENT: Positive for congestion and sore throat. Negative for ear pain and sinus pain.        Loss of smell.  Respiratory: Positive for cough. Negative for shortness of breath.   Cardiovascular: Negative for chest pain.  Musculoskeletal: Negative for myalgias.  Neurological: Negative for headaches.     Labs/Other Tests and Data Reviewed:    Recent Labs: 11/16/2019: ALT 16; BUN 8; Creatinine, Ser 0.86; Hemoglobin 13.4; Platelets 316; Potassium 4.0; Sodium 141; TSH 1.700   Recent Lipid Panel Lab Results  Component Value Date/Time   CHOL 167 11/16/2019 07:49 AM   TRIG 71 11/16/2019 07:49 AM   HDL 44 11/16/2019 07:49 AM   CHOLHDL 3.8 11/16/2019 07:49 AM    LDLCALC 109 (H) 11/16/2019 07:49 AM    Wt Readings from Last 3 Encounters:  12/04/19 192 lb 6.4 oz (87.3 kg)  11/15/19 190 lb 9.6 oz (86.5 kg)  09/04/19 186 lb (84.4 kg)     Objective:    Vital Signs:  There were no vitals taken for this visit.   Physical Exam  Hoarse voice.  ASSESSMENT & PLAN:   1. URI, acute Zithromax 500 mg once daily x 3 days.  - Novel Coronavirus, NAA ([facility]) pending - POC COVID-19 BinaxNow negaitve  COVID-19 Education: The signs and symptoms of COVID-19 were discussed with the patient and  how to seek care for testing (follow up with PCP or arrange E-visit). The importance of social distancing was discussed today.   I spent 15 minutes dedicated to the care of this patient .  Follow Up:  Virtual Visit  prn  Signed,  Rochel Brome, MD  08/10/2020 5:26 PM    [place]

## 2020-08-11 ENCOUNTER — Encounter: Payer: Self-pay | Admitting: Family Medicine

## 2020-08-11 ENCOUNTER — Telehealth (INDEPENDENT_AMBULATORY_CARE_PROVIDER_SITE_OTHER): Payer: 59 | Admitting: Family Medicine

## 2020-08-11 DIAGNOSIS — Z20822 Contact with and (suspected) exposure to covid-19: Secondary | ICD-10-CM | POA: Diagnosis not present

## 2020-08-11 DIAGNOSIS — J069 Acute upper respiratory infection, unspecified: Secondary | ICD-10-CM | POA: Diagnosis not present

## 2020-08-11 LAB — POC COVID19 BINAXNOW: SARS Coronavirus 2 Ag: NEGATIVE

## 2020-08-11 MED ORDER — AZITHROMYCIN 500 MG PO TABS: ORAL_TABLET | ORAL | 0 refills | Status: DC

## 2020-08-13 LAB — SARS-COV-2, NAA 2 DAY TAT

## 2020-08-13 LAB — NOVEL CORONAVIRUS, NAA: SARS-CoV-2, NAA: NOT DETECTED

## 2020-09-09 ENCOUNTER — Other Ambulatory Visit: Payer: Self-pay

## 2020-09-09 ENCOUNTER — Encounter: Payer: Self-pay | Admitting: Nurse Practitioner

## 2020-09-09 ENCOUNTER — Ambulatory Visit (INDEPENDENT_AMBULATORY_CARE_PROVIDER_SITE_OTHER): Payer: 59 | Admitting: Nurse Practitioner

## 2020-09-09 VITALS — BP 120/82 | Ht 63.0 in | Wt 187.6 lb

## 2020-09-09 DIAGNOSIS — Z3045 Encounter for surveillance of transdermal patch hormonal contraceptive device: Secondary | ICD-10-CM | POA: Diagnosis not present

## 2020-09-09 DIAGNOSIS — Z01419 Encounter for gynecological examination (general) (routine) without abnormal findings: Secondary | ICD-10-CM | POA: Diagnosis not present

## 2020-09-09 MED ORDER — NORELGESTROMIN-ETH ESTRADIOL 150-35 MCG/24HR TD PTWK: MEDICATED_PATCH | TRANSDERMAL | 3 refills | Status: DC

## 2020-09-09 NOTE — Patient Instructions (Signed)
Health Maintenance, Female Adopting a healthy lifestyle and getting preventive care are important in promoting health and wellness. Ask your health care provider about:  The right schedule for you to have regular tests and exams.  Things you can do on your own to prevent diseases and keep yourself healthy. What should I know about diet, weight, and exercise? Eat a healthy diet   Eat a diet that includes plenty of vegetables, fruits, low-fat dairy products, and lean protein.  Do not eat a lot of foods that are high in solid fats, added sugars, or sodium. Maintain a healthy weight Body mass index (BMI) is used to identify weight problems. It estimates body fat based on height and weight. Your health care provider can help determine your BMI and help you achieve or maintain a healthy weight. Get regular exercise Get regular exercise. This is one of the most important things you can do for your health. Most adults should:  Exercise for at least 150 minutes each week. The exercise should increase your heart rate and make you sweat (moderate-intensity exercise).  Do strengthening exercises at least twice a week. This is in addition to the moderate-intensity exercise.  Spend less time sitting. Even light physical activity can be beneficial. Watch cholesterol and blood lipids Have your blood tested for lipids and cholesterol at 45 years of age, then have this test every 5 years. Have your cholesterol levels checked more often if:  Your lipid or cholesterol levels are high.  You are older than 45 years of age.  You are at high risk for heart disease. What should I know about cancer screening? Depending on your health history and family history, you may need to have cancer screening at various ages. This may include screening for:  Breast cancer.  Cervical cancer.  Colorectal cancer.  Skin cancer.  Lung cancer. What should I know about heart disease, diabetes, and high blood  pressure? Blood pressure and heart disease  High blood pressure causes heart disease and increases the risk of stroke. This is more likely to develop in people who have high blood pressure readings, are of African descent, or are overweight.  Have your blood pressure checked: ? Every 3-5 years if you are 18-39 years of age. ? Every year if you are 40 years old or older. Diabetes Have regular diabetes screenings. This checks your fasting blood sugar level. Have the screening done:  Once every three years after age 40 if you are at a normal weight and have a low risk for diabetes.  More often and at a younger age if you are overweight or have a high risk for diabetes. What should I know about preventing infection? Hepatitis B If you have a higher risk for hepatitis B, you should be screened for this virus. Talk with your health care provider to find out if you are at risk for hepatitis B infection. Hepatitis C Testing is recommended for:  Everyone born from 1945 through 1965.  Anyone with known risk factors for hepatitis C. Sexually transmitted infections (STIs)  Get screened for STIs, including gonorrhea and chlamydia, if: ? You are sexually active and are younger than 45 years of age. ? You are older than 45 years of age and your health care provider tells you that you are at risk for this type of infection. ? Your sexual activity has changed since you were last screened, and you are at increased risk for chlamydia or gonorrhea. Ask your health care provider if   you are at risk.  Ask your health care provider about whether you are at high risk for HIV. Your health care provider may recommend a prescription medicine to help prevent HIV infection. If you choose to take medicine to prevent HIV, you should first get tested for HIV. You should then be tested every 3 months for as long as you are taking the medicine. Pregnancy  If you are about to stop having your period (premenopausal) and  you may become pregnant, seek counseling before you get pregnant.  Take 400 to 800 micrograms (mcg) of folic acid every day if you become pregnant.  Ask for birth control (contraception) if you want to prevent pregnancy. Osteoporosis and menopause Osteoporosis is a disease in which the bones lose minerals and strength with aging. This can result in bone fractures. If you are 65 years old or older, or if you are at risk for osteoporosis and fractures, ask your health care provider if you should:  Be screened for bone loss.  Take a calcium or vitamin D supplement to lower your risk of fractures.  Be given hormone replacement therapy (HRT) to treat symptoms of menopause. Follow these instructions at home: Lifestyle  Do not use any products that contain nicotine or tobacco, such as cigarettes, e-cigarettes, and chewing tobacco. If you need help quitting, ask your health care provider.  Do not use street drugs.  Do not share needles.  Ask your health care provider for help if you need support or information about quitting drugs. Alcohol use  Do not drink alcohol if: ? Your health care provider tells you not to drink. ? You are pregnant, may be pregnant, or are planning to become pregnant.  If you drink alcohol: ? Limit how much you use to 0-1 drink a day. ? Limit intake if you are breastfeeding.  Be aware of how much alcohol is in your drink. In the [place] one drink equals one 12 oz bottle of beer (355 mL), one 5 oz glass of wine (148 mL), or one 1 oz glass of hard liquor (44 mL). General instructions  Schedule regular health, dental, and eye exams.  Stay current with your vaccines.  Tell your health care provider if: ? You often feel depressed. ? You have ever been abused or do not feel safe at home. Summary  Adopting a healthy lifestyle and getting preventive care are important in promoting health and wellness.  Follow your health care provider's instructions about healthy  diet, exercising, and getting tested or screened for diseases.  Follow your health care provider's instructions on monitoring your cholesterol and blood pressure. This information is not intended to replace advice given to you by your health care provider. Make sure you discuss any questions you have with your health care provider. Document Revised: 08/16/2018 Document Reviewed: 08/16/2018 Elsevier Patient Education  2020 Elsevier Inc.  

## 2020-09-09 NOTE — Progress Notes (Signed)
   [name] [birth year] [id number]   History:  45 y.o. G1P1001 presents for annual exam without GYN complaints. Monthly cycle on Ortho Evra. Normal pap and mammogram history.   Gynecologic History Patient's last menstrual period was 08/13/2020.   Contraception: Ortho-Evra patches weekly Last Pap: 09/04/2019. Results were: normal Last mammogram: 11/2018. Results were: normal  Past medical history, past surgical history, family history and social history were all reviewed and documented in the EPIC chart.  ROS:  A ROS was performed and pertinent positives and negatives are included.  Exam:  Vitals:   09/09/20 1356  BP: 120/82  Weight: 187 lb 9.6 oz (85.1 kg)  Height: 5' 3"  (1.6 m)   Body mass index is 33.23 kg/m.  General appearance:  Normal Thyroid:  Symmetrical, normal in size, without palpable masses or nodularity. Respiratory  Auscultation:  Clear without wheezing or rhonchi Cardiovascular  Auscultation:  Regular rate, without rubs, murmurs or gallops  Edema/varicosities:  Not grossly evident Abdominal  Soft,nontender, without masses, guarding or rebound.  Liver/spleen:  No organomegaly noted  Hernia:  None appreciated  Skin  Inspection:  Grossly normal   Breasts: Examined lying and sitting.   Right: Without masses, retractions, discharge or axillary adenopathy.   Left: Without masses, retractions, discharge or axillary adenopathy. Gentitourinary   Inguinal/mons:  Normal without inguinal adenopathy  External genitalia:  Normal  BUS/Urethra/Skene's glands:  Normal  Vagina:  Normal  Cervix:  Normal  Uterus:  Normal in size, shape and contour.  Midline and mobile  Adnexa/parametria:     Rt: Without masses or tenderness.   Lt: Without masses or tenderness.  Anus and perineum: Normal  Assessment/Plan:  45 y.o. G1P1001 for annual exam.    Well woman exam with routine gynecological exam - Education provided on SBEs, importance of preventative screenings,  current guidelines, high calcium diet, regular exercise, and multivitamin daily. Labs with PCP.   Encounter for surveillance of transdermal patch hormonal contraceptive device - Plan: norelgestromin-ethinyl estradiol (ORTHO EVRA) 150-35 MCG/24HR transdermal patch. Taking as prescribed. Refill x 1 year provided.   Screening for cervical cancer - Normal Pap history.  Will repeat at 5-year interval per guidelines.  Screening for breast cancer - Normal mammogram history. Overdue for mammogram. Discussed current guidelines and importance of preventative screenings.  Normal breast exam today.  Follow up in 1 year for annual.      Tamela Gammon [facility], 2:03 PM 09/09/2020

## 2020-09-23 ENCOUNTER — Ambulatory Visit (INDEPENDENT_AMBULATORY_CARE_PROVIDER_SITE_OTHER): Payer: 59

## 2020-09-23 DIAGNOSIS — Z20822 Contact with and (suspected) exposure to covid-19: Secondary | ICD-10-CM | POA: Diagnosis not present

## 2020-09-23 LAB — POC COVID19 BINAXNOW: SARS Coronavirus 2 Ag: NEGATIVE

## 2020-09-23 NOTE — Progress Notes (Signed)
Patient Name: [name] Date of Birth: 1976/01/05 MRN:  [id number]  Seleny Allbright is a 45 y.o. yo female presenting for COVID-19 testing.  She is being tested from the vehicle.    Coral Timme is being tested due to a close family member tested positive.  She denies any symptoms.  Results for orders placed or performed in visit on 09/23/20 (from the past 24 hour(s))  POC COVID-19     Status: Normal   Collection Time: 09/23/20  4:15 PM  Result Value Ref Range   SARS Coronavirus 2 Ag Negative Negative   PCR was sent to [place], LPN 0:17 PM

## 2020-09-24 LAB — SARS-COV-2, NAA 2 DAY TAT

## 2020-09-24 LAB — NOVEL CORONAVIRUS, NAA: SARS-CoV-2, NAA: NOT DETECTED

## 2020-09-25 ENCOUNTER — Ambulatory Visit: Payer: 59

## 2020-09-28 ENCOUNTER — Other Ambulatory Visit: Payer: Self-pay | Admitting: Family Medicine

## 2020-09-28 DIAGNOSIS — J069 Acute upper respiratory infection, unspecified: Secondary | ICD-10-CM

## 2020-09-28 MED ORDER — AZITHROMYCIN 500 MG PO TABS: ORAL_TABLET | ORAL | 0 refills | Status: DC

## 2020-10-06 ENCOUNTER — Ambulatory Visit: Payer: 59 | Admitting: Legal Medicine

## 2020-10-20 ENCOUNTER — Ambulatory Visit (INDEPENDENT_AMBULATORY_CARE_PROVIDER_SITE_OTHER): Payer: 59 | Admitting: Legal Medicine

## 2020-10-20 ENCOUNTER — Encounter: Payer: Self-pay | Admitting: Legal Medicine

## 2020-10-20 ENCOUNTER — Other Ambulatory Visit: Payer: Self-pay

## 2020-10-20 VITALS — BP 130/70 | HR 70 | Temp 97.6°F | Resp 16 | Ht 63.0 in | Wt 182.2 lb

## 2020-10-20 DIAGNOSIS — E559 Vitamin D deficiency, unspecified: Secondary | ICD-10-CM | POA: Diagnosis not present

## 2020-10-20 DIAGNOSIS — Z0000 Encounter for general adult medical examination without abnormal findings: Secondary | ICD-10-CM

## 2020-10-20 DIAGNOSIS — S0300XA Dislocation of jaw, unspecified side, initial encounter: Secondary | ICD-10-CM

## 2020-10-20 MED ORDER — VITAMIN D (ERGOCALCIFEROL) 1.25 MG (50000 UNIT) PO CAPS: 50000.0000 [IU] | ORAL_CAPSULE | ORAL | 3 refills | Status: DC

## 2020-10-20 MED ORDER — ALPRAZOLAM 0.5 MG PO TBDP: 0.5000 mg | ORAL_TABLET | Freq: Every evening | ORAL | 3 refills | Status: DC | PRN

## 2020-10-20 MED ORDER — LEVOCETIRIZINE DIHYDROCHLORIDE 5 MG PO TABS: 5.0000 mg | ORAL_TABLET | Freq: Every evening | ORAL | 2 refills | Status: DC

## 2020-10-20 NOTE — Patient Instructions (Signed)
Preventive Care 84-45 Years Old, Female Preventive care refers to lifestyle choices and visits with your health care provider that can promote health and wellness. This includes:  A yearly physical exam. This is also called an annual wellness visit.  Regular dental and eye exams.  Immunizations.  Screening for certain conditions.  Healthy lifestyle choices, such as: ? Eating a healthy diet. ? Getting regular exercise. ? Not using drugs or products that contain nicotine and tobacco. ? Limiting alcohol use. What can I expect for my preventive care visit? Physical exam Your health care provider will check your:  Height and weight. These may be used to calculate your BMI (body mass index). BMI is a measurement that tells if you are at a healthy weight.  Heart rate and blood pressure.  Body temperature.  Skin for abnormal spots. Counseling Your health care provider may ask you questions about your:  Past medical problems.  Family's medical history.  Alcohol, tobacco, and drug use.  Emotional well-being.  Home life and relationship well-being.  Sexual activity.  Diet, exercise, and sleep habits.  Work and work Statistician.  Access to firearms.  Method of birth control.  Menstrual cycle.  Pregnancy history. What immunizations do I need? Vaccines are usually given at various ages, according to a schedule. Your health care provider will recommend vaccines for you based on your age, medical history, and lifestyle or other factors, such as travel or where you work.   What tests do I need? Blood tests  Lipid and cholesterol levels. These may be checked every 5 years, or more often if you are over 3 years old.  Hepatitis C test.  Hepatitis B test. Screening  Lung cancer screening. You may have this screening every year starting at age 73 if you have a 30-pack-year history of smoking and currently smoke or have quit within the past 15 years.  Colorectal cancer  screening. ? All adults should have this screening starting at age 52 and continuing until age 17. ? Your health care provider may recommend screening at age 49 if you are at increased risk. ? You will have tests every 1-10 years, depending on your results and the type of screening test.  Diabetes screening. ? This is done by checking your blood sugar (glucose) after you have not eaten for a while (fasting). ? You may have this done every 1-3 years.  Mammogram. ? This may be done every 1-2 years. ? Talk with your health care provider about when you should start having regular mammograms. This may depend on whether you have a family history of breast cancer.  BRCA-related cancer screening. This may be done if you have a family history of breast, ovarian, tubal, or peritoneal cancers.  Pelvic exam and Pap test. ? This may be done every 3 years starting at age 10. ? Starting at age 11, this may be done every 5 years if you have a Pap test in combination with an HPV test. Other tests  STD (sexually transmitted disease) testing, if you are at risk.  Bone density scan. This is done to screen for osteoporosis. You may have this scan if you are at high risk for osteoporosis. Talk with your health care provider about your test results, treatment options, and if necessary, the need for more tests. Follow these instructions at home: Eating and drinking  Eat a diet that includes fresh fruits and vegetables, whole grains, lean protein, and low-fat dairy products.  Take vitamin and mineral supplements  as recommended by your health care provider.  Do not drink alcohol if: ? Your health care provider tells you not to drink. ? You are pregnant, may be pregnant, or are planning to become pregnant.  If you drink alcohol: ? Limit how much you have to 0-1 drink a day. ? Be aware of how much alcohol is in your drink. In the [place] one drink equals one 12 oz bottle of beer (355 mL), one 5 oz glass of  wine (148 mL), or one 1 oz glass of hard liquor (44 mL).   Lifestyle  Take daily care of your teeth and gums. Brush your teeth every morning and night with fluoride toothpaste. Floss one time each day.  Stay active. Exercise for at least 30 minutes 5 or more days each week.  Do not use any products that contain nicotine or tobacco, such as cigarettes, e-cigarettes, and chewing tobacco. If you need help quitting, ask your health care provider.  Do not use drugs.  If you are sexually active, practice safe sex. Use a condom or other form of protection to prevent STIs (sexually transmitted infections).  If you do not wish to become pregnant, use a form of birth control. If you plan to become pregnant, see your health care provider for a prepregnancy visit.  If told by your health care provider, take low-dose aspirin daily starting at age 50.  Find healthy ways to cope with stress, such as: ? Meditation, yoga, or listening to music. ? Journaling. ? Talking to a trusted person. ? Spending time with friends and family. Safety  Always wear your seat belt while driving or riding in a vehicle.  Do not drive: ? If you have been drinking alcohol. Do not ride with someone who has been drinking. ? When you are tired or distracted. ? While texting.  Wear a helmet and other protective equipment during sports activities.  If you have firearms in your house, make sure you follow all gun safety procedures. What's next?  Visit your health care provider once a year for an annual wellness visit.  Ask your health care provider how often you should have your eyes and teeth checked.  Stay up to date on all vaccines. This information is not intended to replace advice given to you by your health care provider. Make sure you discuss any questions you have with your health care provider. Document Revised: 05/27/2020 Document Reviewed: 05/04/2018 Elsevier Patient Education  2021 Elsevier Inc.  

## 2020-10-20 NOTE — Progress Notes (Signed)
Subjective:  Patient ID: [name], female    DOB: [birth year]  Age: 45 y.o. MRN: [id number]  Chief Complaint  Patient presents with  . Annual Exam    CPE    HPI  Well Adult Physical: Patient here for a comprehensive physical exam.The patient reports no problems Do you take any herbs or supplements that were not prescribed by a doctor? no Are you taking calcium supplements? no Are you taking aspirin daily? no  Encounter for general adult medical examination without abnormal findings  Physical ("At Risk" items are starred): Patient's last physical exam was 1 year ago .  Smoking: Life-long non-smoker ;  Physical Activity: Exercises at least 3 times per week ;  Alcohol/Drug Use: Is a non-drinker ; No illicit drug use ;  Patient is not afflicted from Stress Incontinence and Urge Incontinence  Safety: reviewed ; Patient wears a seat belt, has smoke detectors, has carbon monoxide detectors, practices appropriate gun safety, and wears sunscreen with extended sun exposure. Dental Care: biannual cleanings, brushes and flosses daily. Ophthalmology/Optometry: Annual visit.  Hearing loss: none Vision impairments: need glasses   Flowsheet Row Office Visit from 10/20/2020 in [facility]  PHQ-2 Total Score 0              Social History   Socioeconomic History  . Marital status: Single    Spouse name: Not on file  . Number of children: 1  . Years of education: Not on file  . Highest education level: Bachelor's degree (e.g., BA, AB, BS)  Occupational History  . Occupation: Psychologist, sport and exercise  Tobacco Use  . Smoking status: Never Smoker  . Smokeless tobacco: Never Used  Vaping Use  . Vaping Use: Never used  Substance and Sexual Activity  . Alcohol use: Yes    Alcohol/week: 2.0 standard drinks    Types: 2 Cans of beer per week    Comment: BEER - WEEKENDS  . Drug use: Never  . Sexual activity: Yes    Comment: 1ST INTERCOURSE- 81, PARTNERS- 7  Other Topics Concern   . Not on file  Social History Narrative  . Not on file   Social Determinants of Health   Financial Resource Strain: Not on file  Food Insecurity: Not on file  Transportation Needs: Not on file  Physical Activity: Not on file  Stress: Not on file  Social Connections: Not on file   Past Medical History:  Diagnosis Date  . AMA (advanced maternal age) multigravida 27+   . Medical history non-contributory    Past Surgical History:  Procedure Laterality Date  . CESAREAN SECTION  2016  . NO PAST SURGERIES      Family History  Problem Relation Age of Onset  . Diabetes Mother   . High blood pressure Mother   . Diabetes Father   . High blood pressure Father   . Diabetes Paternal Grandmother   . Diabetes Paternal Grandfather   . High blood pressure Sister    Social History   Socioeconomic History  . Marital status: Single    Spouse name: Not on file  . Number of children: 1  . Years of education: Not on file  . Highest education level: Bachelor's degree (e.g., BA, AB, BS)  Occupational History  . Occupation: Psychologist, sport and exercise  Tobacco Use  . Smoking status: Never Smoker  . Smokeless tobacco: Never Used  Vaping Use  . Vaping Use: Never used  Substance and Sexual Activity  . Alcohol use: Yes  Alcohol/week: 2.0 standard drinks    Types: 2 Cans of beer per week    Comment: BEER - WEEKENDS  . Drug use: Never  . Sexual activity: Yes    Comment: 1ST INTERCOURSE- 3, PARTNERS- 7  Other Topics Concern  . Not on file  Social History Narrative  . Not on file   Social Determinants of Health   Financial Resource Strain: Not on file  Food Insecurity: Not on file  Transportation Needs: Not on file  Physical Activity: Not on file  Stress: Not on file  Social Connections: Not on file   Review of Systems  Constitutional: Negative for activity change and fatigue.  HENT: Negative for congestion and sinus pain.   Eyes: Negative for visual disturbance.  Respiratory:  Negative for chest tightness and shortness of breath.   Cardiovascular: Negative for chest pain, palpitations and leg swelling.  Gastrointestinal: Negative for abdominal distention and abdominal pain.  Endocrine: Negative for polyuria.  Genitourinary: Negative for difficulty urinating, dysuria and urgency.  Musculoskeletal: Negative for arthralgias and back pain.  Skin: Negative.   Neurological: Negative.   Psychiatric/Behavioral: Negative.      Objective:  BP 130/70 (BP Location: Right Arm, Patient Position: Sitting, Cuff Size: Normal)   Pulse 70   Temp 97.6 F (36.4 C) (Temporal)   Resp 16   Ht 5' 3"  (1.6 m)   Wt 182 lb 3.2 oz (82.6 kg)   SpO2 98%   BMI 32.28 kg/m   BP/Weight 10/20/2020 09/09/2020 12/13/8117  Systolic BP 147 829 562  Diastolic BP 70 82 72  Wt. (Lbs) 182.2 187.6 -  BMI 32.28 33.23 -    Physical Exam Vitals reviewed.  Constitutional:      Appearance: Normal appearance.  HENT:     Head: Normocephalic and atraumatic.     Right Ear: Tympanic membrane, ear canal and external ear normal.     Left Ear: Tympanic membrane, ear canal and external ear normal.     Mouth/Throat:     Mouth: Mucous membranes are moist.     Pharynx: Oropharynx is clear.  Eyes:     Extraocular Movements: Extraocular movements intact.     Conjunctiva/sclera: Conjunctivae normal.     Pupils: Pupils are equal, round, and reactive to light.  Cardiovascular:     Rate and Rhythm: Normal rate and regular rhythm.     Pulses: Normal pulses.     Heart sounds: Normal heart sounds. No murmur heard. No gallop.   Pulmonary:     Effort: Pulmonary effort is normal. No respiratory distress.     Breath sounds: Normal breath sounds.  Abdominal:     General: Abdomen is flat. Bowel sounds are normal. There is no distension.     Palpations: Abdomen is soft.     Tenderness: There is no abdominal tenderness.  Musculoskeletal:     Cervical back: Normal range of motion and neck supple.  Skin:     General: Skin is warm and dry.     Capillary Refill: Capillary refill takes less than 2 seconds.  Neurological:     General: No focal deficit present.     Mental Status: She is alert and oriented to person, place, and time.  Psychiatric:        Mood and Affect: Mood normal.     Lab Results  Component Value Date   WBC 9.3 11/16/2019   HGB 13.4 11/16/2019   HCT 41.4 11/16/2019   PLT 316 11/16/2019   GLUCOSE 80  11/16/2019   CHOL 167 11/16/2019   TRIG 71 11/16/2019   HDL 44 11/16/2019   LDLCALC 109 (H) 11/16/2019   ALT 16 11/16/2019   AST 20 11/16/2019   NA 141 11/16/2019   K 4.0 11/16/2019   CL 106 11/16/2019   CREATININE 0.86 11/16/2019   BUN 8 11/16/2019   CO2 20 11/16/2019   TSH 1.700 11/16/2019   HGBA1C 5.4 11/16/2019      Assessment & Plan:  1. Routine general medical examination at a health care facility - CBC with Differential/Platelet - Comprehensive metabolic panel - Lipid panel - Hemoglobin A1c - TSH Routine physical performed, she is healthy and routine labs performed  2. Vitamin D deficiency - VITAMIN D 25 Hydroxy (Vit-D Deficiency, Fractures) Renew vitamin D supplements    Body mass index is 32.28 kg/m.   These are the goals we discussed: Goals   None      This is a list of the screening recommended for you and due dates:  Health Maintenance  Topic Date Due  .  Hepatitis C: One time screening is recommended by Center for Disease Control  (CDC) for  adults born from [birth year] through 1965.   Never done  . Tetanus Vaccine  Never done  . COVID-19 Vaccine (3 - Booster for Pfizer series) 11/05/2020*  . Pap Smear  09/03/2022  . Flu Shot  Completed  . HIV Screening  Completed  *Topic was postponed. The date shown is not the original due date.     AN INDIVIDUALIZED CARE PLAN: was established or reinforced today.   SELF MANAGEMENT: The patient and I together assessed ways to personally work towards obtaining the recommended goals  Support needs The  patient and/or family needs were assessed and services were offered if appropriate.     Follow-up: Return if symptoms worsen or fail to improve.  An After Visit Summary was printed and given to the patient.  Reinaldo Meeker, MD [facility] [phone]

## 2020-10-21 ENCOUNTER — Other Ambulatory Visit: Payer: Self-pay

## 2020-10-21 DIAGNOSIS — E559 Vitamin D deficiency, unspecified: Secondary | ICD-10-CM

## 2020-10-21 LAB — TSH: TSH: 1.52 u[IU]/mL (ref 0.450–4.500)

## 2020-10-21 LAB — HEMOGLOBIN A1C
Est. average glucose Bld gHb Est-mCnc: 114 mg/dL
Hgb A1c MFr Bld: 5.6 % (ref 4.8–5.6)

## 2020-10-21 MED ORDER — VITAMIN D (ERGOCALCIFEROL) 1.25 MG (50000 UNIT) PO CAPS: 50000.0000 [IU] | ORAL_CAPSULE | ORAL | 3 refills | Status: DC

## 2020-10-21 MED ORDER — ALPRAZOLAM 0.5 MG PO TABS: 0.5000 mg | ORAL_TABLET | Freq: Every evening | ORAL | 2 refills | Status: DC | PRN

## 2020-10-21 MED ORDER — TERBINAFINE HCL 250 MG PO TABS: 250.0000 mg | ORAL_TABLET | Freq: Every day | ORAL | 1 refills | Status: DC

## 2020-10-21 NOTE — Progress Notes (Signed)
A1c and TSH normal lp

## 2020-10-25 LAB — COMPREHENSIVE METABOLIC PANEL
ALT: 15 IU/L (ref 0–32)
AST: 16 IU/L (ref 0–40)
Albumin/Globulin Ratio: 1.4 (ref 1.2–2.2)
Albumin: 4.1 g/dL (ref 3.8–4.8)
Alkaline Phosphatase: 89 IU/L (ref 44–121)
BUN/Creatinine Ratio: 17 (ref 9–23)
BUN: 11 mg/dL (ref 6–24)
Bilirubin Total: 0.2 mg/dL (ref 0.0–1.2)
CO2: 22 mmol/L (ref 20–29)
Calcium: 9.3 mg/dL (ref 8.7–10.2)
Chloride: 103 mmol/L (ref 96–106)
Creatinine, Ser: 0.66 mg/dL (ref 0.57–1.00)
GFR calc Af Amer: 124 mL/min/{1.73_m2} (ref >59)
GFR calc non Af Amer: 108 mL/min/{1.73_m2} (ref >59)
Globulin, Total: 2.9 g/dL (ref 1.5–4.5)
Glucose: 82 mg/dL (ref 65–99)
Potassium: 4.1 mmol/L (ref 3.5–5.2)
Sodium: 140 mmol/L (ref 134–144)
Total Protein: 7 g/dL (ref 6.0–8.5)

## 2020-10-25 LAB — CBC WITH DIFFERENTIAL/PLATELET

## 2020-10-25 LAB — LIPID PANEL
Chol/HDL Ratio: 3.4 ratio (ref 0.0–4.4)
Cholesterol, Total: 165 mg/dL (ref 100–199)
HDL: 48 mg/dL (ref >39)
LDL Chol Calc (NIH): 96 mg/dL (ref 0–99)
Triglycerides: 118 mg/dL (ref 0–149)
VLDL Cholesterol Cal: 21 mg/dL (ref 5–40)

## 2020-10-25 LAB — VITAMIN D 25 HYDROXY (VIT D DEFICIENCY, FRACTURES): Vit D, 25-Hydroxy: 51 ng/mL (ref 30.0–100.0)

## 2020-10-25 LAB — CARDIOVASCULAR RISK ASSESSMENT

## 2020-10-26 NOTE — Progress Notes (Signed)
Kidney and liver tests normal, cholesterol normal, vitamin D 51 normal,  lp

## 2020-10-30 ENCOUNTER — Other Ambulatory Visit: Payer: Self-pay

## 2020-10-30 ENCOUNTER — Ambulatory Visit (INDEPENDENT_AMBULATORY_CARE_PROVIDER_SITE_OTHER): Payer: 59 | Admitting: Legal Medicine

## 2020-10-30 ENCOUNTER — Encounter: Payer: Self-pay | Admitting: Legal Medicine

## 2020-10-30 VITALS — BP 130/84 | HR 96 | Temp 97.7°F | Ht 63.0 in | Wt 183.0 lb

## 2020-10-30 DIAGNOSIS — J0100 Acute maxillary sinusitis, unspecified: Secondary | ICD-10-CM

## 2020-10-30 DIAGNOSIS — J329 Chronic sinusitis, unspecified: Secondary | ICD-10-CM | POA: Insufficient documentation

## 2020-10-30 MED ORDER — PREDNISONE 10 MG (21) PO TBPK: ORAL_TABLET | ORAL | 0 refills | Status: DC

## 2020-10-30 MED ORDER — CEPHALEXIN 500 MG PO CAPS: 500.0000 mg | ORAL_CAPSULE | Freq: Four times a day (QID) | ORAL | 0 refills | Status: DC

## 2020-10-30 NOTE — Progress Notes (Signed)
Acute Office Visit  Subjective:    Patient ID: [name], female    DOB: February 05, 1976, 45 y.o.   MRN: [id number]  Chief Complaint  Patient presents with  . Sinusitis    HPI Patient is in today for sinus pain and pressure, symptoms started Monday. Has been taking claritin, benadryl and ibuprofen.   Past Medical History:  Diagnosis Date  . AMA (advanced maternal age) multigravida 41+   . Medical history non-contributory     Past Surgical History:  Procedure Laterality Date  . CESAREAN SECTION  2016  . NO PAST SURGERIES      Family History  Problem Relation Age of Onset  . Diabetes Mother   . High blood pressure Mother   . Diabetes Father   . High blood pressure Father   . Diabetes Paternal Grandmother   . Diabetes Paternal Grandfather   . High blood pressure Sister     Social History   Socioeconomic History  . Marital status: Single    Spouse name: Not on file  . Number of children: 1  . Years of education: Not on file  . Highest education level: Bachelor's degree (e.g., BA, AB, BS)  Occupational History  . Occupation: Psychologist, sport and exercise  Tobacco Use  . Smoking status: Never Smoker  . Smokeless tobacco: Never Used  Vaping Use  . Vaping Use: Never used  Substance and Sexual Activity  . Alcohol use: Yes    Alcohol/week: 2.0 standard drinks    Types: 2 Cans of beer per week    Comment: BEER - WEEKENDS  . Drug use: Never  . Sexual activity: Yes    Comment: 1ST INTERCOURSE- 75, PARTNERS- 7  Other Topics Concern  . Not on file  Social History Narrative  . Not on file   Social Determinants of Health   Financial Resource Strain: Not on file  Food Insecurity: Not on file  Transportation Needs: Not on file  Physical Activity: Not on file  Stress: Not on file  Social Connections: Not on file  Intimate Partner Violence: Not on file    Outpatient Medications Prior to Visit  Medication Sig Dispense Refill  . ALPRAZolam (XANAX) 0.5 MG tablet Take 1  tablet (0.5 mg total) by mouth at bedtime as needed. 30 tablet 2  . levocetirizine (XYZAL) 5 MG tablet Take 1 tablet (5 mg total) by mouth every evening. 30 tablet 2  . norelgestromin-ethinyl estradiol (ORTHO EVRA) 150-35 MCG/24HR transdermal patch 1 patch per week, place on lower abd, hip, never on breasts on Sunday after cycle starts. 9 patch 3  . terbinafine (LAMISIL) 250 MG tablet Take 1 tablet (250 mg total) by mouth daily. 30 tablet 1  . Vitamin D, Ergocalciferol, (DRISDOL) 1.25 MG (50000 UNIT) CAPS capsule Take 1 capsule (50,000 Units total) by mouth every 7 (seven) days. 12 capsule 3   No facility-administered medications prior to visit.    No Known Allergies  Review of Systems  Constitutional: Negative for chills, fatigue and fever.  HENT: Positive for congestion, dental problem (Teeth hurt from sinuses), postnasal drip, rhinorrhea, sinus pressure and sinus pain. Negative for ear pain and sore throat.   Respiratory: Negative for cough and shortness of breath.   Cardiovascular: Negative.  Negative for chest pain.  Gastrointestinal: Negative.  Negative for diarrhea and nausea.  Genitourinary: Negative.   Musculoskeletal: Negative.   Skin: Negative.   Neurological: Negative for dizziness and headaches.  Hematological: Negative.   Psychiatric/Behavioral: Negative.  Objective:    Physical Exam Vitals reviewed.  Constitutional:      General: She is not in acute distress.    Appearance: Normal appearance.  HENT:     Right Ear: Tympanic membrane normal.     Left Ear: Tympanic membrane normal.     Nose: Nose normal.     Mouth/Throat:     Mouth: Mucous membranes are moist.     Pharynx: Oropharynx is clear.  Eyes:     Extraocular Movements: Extraocular movements intact.     Conjunctiva/sclera: Conjunctivae normal.     Pupils: Pupils are equal, round, and reactive to light.  Cardiovascular:     Rate and Rhythm: Normal rate and regular rhythm.     Pulses: Normal  pulses.     Heart sounds: Normal heart sounds.  Pulmonary:     Effort: Pulmonary effort is normal.     Breath sounds: Normal breath sounds.  Abdominal:     General: Abdomen is flat. Bowel sounds are normal. There is no distension.     Palpations: Abdomen is soft.     Tenderness: There is no abdominal tenderness.  Skin:    General: Skin is warm and dry.     Capillary Refill: Capillary refill takes less than 2 seconds.  Neurological:     General: No focal deficit present.     Mental Status: She is alert and oriented to person, place, and time.     BP 130/84   Pulse 96   Temp 97.7 F (36.5 C)   Ht 5' 3"  (1.6 m)   Wt 183 lb (83 kg)   SpO2 100%   BMI 32.42 kg/m  Wt Readings from Last 3 Encounters:  10/30/20 183 lb (83 kg)  10/20/20 182 lb 3.2 oz (82.6 kg)  09/09/20 187 lb 9.6 oz (85.1 kg)    Health Maintenance Due  Topic Date Due  . Hepatitis C Screening  Never done  . TETANUS/TDAP  Never done    There are no preventive care reminders to display for this patient.   Lab Results  Component Value Date   TSH 1.520 10/20/2020   Lab Results  Component Value Date   WBC CANCELED 10/20/2020   HGB CANCELED 10/20/2020   HCT CANCELED 10/20/2020   MCV 89 11/16/2019   PLT CANCELED 10/20/2020   Lab Results  Component Value Date   NA 140 10/20/2020   K 4.1 10/20/2020   CO2 22 10/20/2020   GLUCOSE 82 10/20/2020   BUN 11 10/20/2020   CREATININE 0.66 10/20/2020   BILITOT 0.2 10/20/2020   ALKPHOS 89 10/20/2020   AST 16 10/20/2020   ALT 15 10/20/2020   PROT 7.0 10/20/2020   ALBUMIN 4.1 10/20/2020   CALCIUM 9.3 10/20/2020   Lab Results  Component Value Date   CHOL 165 10/20/2020   Lab Results  Component Value Date   HDL 48 10/20/2020   Lab Results  Component Value Date   LDLCALC 96 10/20/2020   Lab Results  Component Value Date   TRIG 118 10/20/2020   Lab Results  Component Value Date   CHOLHDL 3.4 10/20/2020   Lab Results  Component Value Date    HGBA1C 5.6 10/20/2020       Assessment & Plan:  Diagnoses and all orders for this visit: Acute non-recurrent maxillary sinusitis -     cephALEXin (KEFLEX) 500 MG capsule; Take 1 capsule (500 mg total) by mouth 4 (four) times daily. -     predniSONE (  STERAPRED UNI-PAK 21 TAB) 10 MG (21) TBPK tablet; Take 6ills first day , then 5 pills day 2 and then cut down one pill day until gone Patient has acute sinusitis        I spent 15 minutes dedicated to the care of this patient on the date of this encounter to include face-to-face time with the patient, as well as:   Follow-up: Return if symptoms worsen or fail to improve.  An After Visit Summary was printed and given to the patient.  Reinaldo Meeker, MD [facility] [phone]

## 2021-01-02 ENCOUNTER — Encounter: Payer: Self-pay | Admitting: Legal Medicine

## 2021-01-02 ENCOUNTER — Other Ambulatory Visit: Payer: Self-pay

## 2021-01-02 ENCOUNTER — Ambulatory Visit (INDEPENDENT_AMBULATORY_CARE_PROVIDER_SITE_OTHER): Payer: 59 | Admitting: Legal Medicine

## 2021-01-02 DIAGNOSIS — S239XXA Sprain of unspecified parts of thorax, initial encounter: Secondary | ICD-10-CM | POA: Insufficient documentation

## 2021-01-02 MED ORDER — CYCLOBENZAPRINE HCL 5 MG PO TABS: 5.0000 mg | ORAL_TABLET | Freq: Three times a day (TID) | ORAL | 1 refills | Status: DC | PRN

## 2021-01-02 NOTE — Progress Notes (Signed)
Subjective:  Patient ID: [name], female    DOB: 1975-12-25  Age: 45 y.o. MRN: [id number]  Chief Complaint  Patient presents with  . Chest Pain    Shoulder pain Left side    HPI: patient has pain in left shoulder, she is carrying a lot of weight in left arm and is exercising.   Current Outpatient Medications on File Prior to Visit  Medication Sig Dispense Refill  . ALPRAZolam (XANAX) 0.5 MG tablet Take 1 tablet (0.5 mg total) by mouth at bedtime as needed. 30 tablet 2  . levocetirizine (XYZAL) 5 MG tablet Take 1 tablet (5 mg total) by mouth every evening. 30 tablet 2  . norelgestromin-ethinyl estradiol (ORTHO EVRA) 150-35 MCG/24HR transdermal patch 1 patch per week, place on lower abd, hip, never on breasts on Sunday after cycle starts. 9 patch 3  . terbinafine (LAMISIL) 250 MG tablet Take 1 tablet (250 mg total) by mouth daily. 30 tablet 1  . Vitamin D, Ergocalciferol, (DRISDOL) 1.25 MG (50000 UNIT) CAPS capsule Take 1 capsule (50,000 Units total) by mouth every 7 (seven) days. 12 capsule 3   No current facility-administered medications on file prior to visit.   Past Medical History:  Diagnosis Date  . AMA (advanced maternal age) multigravida 71+   . Medical history non-contributory    Past Surgical History:  Procedure Laterality Date  . CESAREAN SECTION  2016  . NO PAST SURGERIES      Family History  Problem Relation Age of Onset  . Diabetes Mother   . High blood pressure Mother   . Diabetes Father   . High blood pressure Father   . Diabetes Paternal Grandmother   . Diabetes Paternal Grandfather   . High blood pressure Sister    Social History   Socioeconomic History  . Marital status: Single    Spouse name: Not on file  . Number of children: 1  . Years of education: Not on file  . Highest education level: Bachelor's degree (e.g., BA, AB, BS)  Occupational History  . Occupation: Psychologist, sport and exercise  Tobacco Use  . Smoking status: Never Smoker  .  Smokeless tobacco: Never Used  Vaping Use  . Vaping Use: Never used  Substance and Sexual Activity  . Alcohol use: Yes    Alcohol/week: 2.0 standard drinks    Types: 2 Cans of beer per week    Comment: BEER - WEEKENDS  . Drug use: Never  . Sexual activity: Yes    Comment: 1ST INTERCOURSE- 64, PARTNERS- 7  Other Topics Concern  . Not on file  Social History Narrative  . Not on file   Social Determinants of Health   Financial Resource Strain: Not on file  Food Insecurity: Not on file  Transportation Needs: Not on file  Physical Activity: Not on file  Stress: Not on file  Social Connections: Not on file    Review of Systems  Constitutional: Negative.  Negative for activity change and appetite change.  HENT: Negative for congestion and rhinorrhea.   Eyes: Negative for visual disturbance.  Respiratory: Negative for chest tightness and shortness of breath.   Cardiovascular: Negative for palpitations and leg swelling.  Gastrointestinal: Negative for abdominal distention and abdominal pain.  Endocrine: Negative for polyuria.  Genitourinary: Negative for difficulty urinating and dysuria.  Musculoskeletal: Positive for back pain (thoracic area).  Skin: Negative.   Neurological: Negative.   Psychiatric/Behavioral: Negative.      Objective:  BP 130/88 (BP Location: Left  Arm, Patient Position: Sitting, Cuff Size: Normal)   Pulse 80   Temp (!) 97.5 F (36.4 C) (Temporal)   Ht 5' 4"  (1.626 m)   Wt 179 lb (81.2 kg)   SpO2 98%   BMI 30.73 kg/m   BP/Weight 01/02/2021 10/30/2020 5/95/3967  Systolic BP 289 791 504  Diastolic BP 88 84 70  Wt. (Lbs) 179 183 182.2  BMI 30.73 32.42 32.28    Physical Exam Vitals reviewed.  Constitutional:      Appearance: Normal appearance. She is well-developed.  Eyes:     Extraocular Movements: Extraocular movements intact.     Conjunctiva/sclera: Conjunctivae normal.     Pupils: Pupils are equal, round, and reactive to light.   Cardiovascular:     Rate and Rhythm: Normal rate and regular rhythm.     Pulses: Normal pulses.     Heart sounds: Normal heart sounds. No murmur heard. No gallop.   Pulmonary:     Effort: Pulmonary effort is normal. No respiratory distress.     Breath sounds: Normal breath sounds. No rales.  Abdominal:     General: Abdomen is flat. Bowel sounds are normal. There is no distension.     Palpations: Abdomen is soft.     Tenderness: There is no abdominal tenderness.  Musculoskeletal:        General: Tenderness (cervical muscles) present.     Cervical back: Tenderness present.  Neurological:     Mental Status: She is alert.       Lab Results  Component Value Date   WBC CANCELED 10/20/2020   HGB CANCELED 10/20/2020   HCT CANCELED 10/20/2020   PLT CANCELED 10/20/2020   GLUCOSE 82 10/20/2020   CHOL 165 10/20/2020   TRIG 118 10/20/2020   HDL 48 10/20/2020   LDLCALC 96 10/20/2020   ALT 15 10/20/2020   AST 16 10/20/2020   NA 140 10/20/2020   K 4.1 10/20/2020   CL 103 10/20/2020   CREATININE 0.66 10/20/2020   BUN 11 10/20/2020   CO2 22 10/20/2020   TSH 1.520 10/20/2020   HGBA1C 5.6 10/20/2020      Assessment & Plan:   Diagnoses and all orders for this visit: Thoracic sprain -     cyclobenzaprine (FLEXERIL) 5 MG tablet; Take 1 tablet (5 mg total) by mouth 3 (three) times daily as needed for muscle spasms. ROM exercises with massage and stretching       Follow-up: Return if symptoms worsen or fail to improve.  An After Visit Summary was printed and given to the patient.  Reinaldo Meeker, MD [facility] [phone]

## 2021-01-09 ENCOUNTER — Other Ambulatory Visit: Payer: Self-pay | Admitting: Legal Medicine

## 2021-01-09 DIAGNOSIS — L309 Dermatitis, unspecified: Secondary | ICD-10-CM

## 2021-01-09 MED ORDER — TRIAMCINOLONE ACETONIDE 0.1 % EX CREA: 1.0000 "application " | TOPICAL_CREAM | Freq: Two times a day (BID) | CUTANEOUS | 3 refills | Status: DC

## 2021-02-25 ENCOUNTER — Encounter: Payer: Self-pay | Admitting: Legal Medicine

## 2021-02-25 ENCOUNTER — Ambulatory Visit (INDEPENDENT_AMBULATORY_CARE_PROVIDER_SITE_OTHER): Payer: 59 | Admitting: Legal Medicine

## 2021-02-25 ENCOUNTER — Other Ambulatory Visit: Payer: Self-pay

## 2021-02-25 VITALS — BP 124/80 | HR 78 | Temp 97.4°F | Ht 63.0 in | Wt 182.0 lb

## 2021-02-25 DIAGNOSIS — R7611 Nonspecific reaction to tuberculin skin test without active tuberculosis: Secondary | ICD-10-CM | POA: Diagnosis not present

## 2021-02-25 DIAGNOSIS — Z111 Encounter for screening for respiratory tuberculosis: Secondary | ICD-10-CM | POA: Diagnosis not present

## 2021-02-25 DIAGNOSIS — Z0000 Encounter for general adult medical examination without abnormal findings: Secondary | ICD-10-CM | POA: Diagnosis not present

## 2021-02-25 DIAGNOSIS — Z1231 Encounter for screening mammogram for malignant neoplasm of breast: Secondary | ICD-10-CM

## 2021-02-25 NOTE — Progress Notes (Signed)
Established Patient Office Visit  Subjective:  Patient ID: [name], female    DOB: 30-Apr-1976  Age: 45 y.o. MRN: [id number]  CC:  Chief Complaint  Patient presents with   Annual Exam    School PE    HPI [name] presents for cpe Patient is healthy.forms completed  Past Medical History:  Diagnosis Date   AMA (advanced maternal age) multigravida 57+    Medical history non-contributory     Past Surgical History:  Procedure Laterality Date   CESAREAN SECTION  2016   NO PAST SURGERIES      Family History  Problem Relation Age of Onset   Diabetes Mother    High blood pressure Mother    Diabetes Father    High blood pressure Father    Diabetes Paternal Grandmother    Diabetes Paternal Grandfather    High blood pressure Sister     Social History   Socioeconomic History   Marital status: Single    Spouse name: Not on file   Number of children: 1   Years of education: Not on file   Highest education level: Bachelor's degree (e.g., BA, AB, BS)  Occupational History   Occupation: Psychologist, sport and exercise  Tobacco Use   Smoking status: Never   Smokeless tobacco: Never  Vaping Use   Vaping Use: Never used  Substance and Sexual Activity   Alcohol use: Yes    Alcohol/week: 2.0 standard drinks    Types: 2 Cans of beer per week    Comment: BEER - WEEKENDS   Drug use: Never   Sexual activity: Yes    Comment: 1ST INTERCOURSE- 87, PARTNERS- 7  Other Topics Concern   Not on file  Social History Narrative   Not on file   Social Determinants of Health   Financial Resource Strain: Not on file  Food Insecurity: Not on file  Transportation Needs: Not on file  Physical Activity: Not on file  Stress: Not on file  Social Connections: Not on file  Intimate Partner Violence: Not on file    Outpatient Medications Prior to Visit  Medication Sig Dispense Refill   ALPRAZolam (XANAX) 0.5 MG tablet Take 1 tablet (0.5 mg total) by mouth at bedtime as needed. 30  tablet 2   cyclobenzaprine (FLEXERIL) 5 MG tablet Take 1 tablet (5 mg total) by mouth 3 (three) times daily as needed for muscle spasms. 30 tablet 1   levocetirizine (XYZAL) 5 MG tablet Take 1 tablet (5 mg total) by mouth every evening. 30 tablet 2   norelgestromin-ethinyl estradiol (ORTHO EVRA) 150-35 MCG/24HR transdermal patch 1 patch per week, place on lower abd, hip, never on breasts on Sunday after cycle starts. 9 patch 3   terbinafine (LAMISIL) 250 MG tablet Take 1 tablet (250 mg total) by mouth daily. 30 tablet 1   triamcinolone cream (KENALOG) 0.1 % Apply 1 application topically 2 (two) times daily. 30 g 3   Vitamin D, Ergocalciferol, (DRISDOL) 1.25 MG (50000 UNIT) CAPS capsule Take 1 capsule (50,000 Units total) by mouth every 7 (seven) days. 12 capsule 3   No facility-administered medications prior to visit.    No Known Allergies  ROS Review of Systems  Constitutional:  Negative for activity change and appetite change.  HENT:  Negative for congestion.   Eyes:  Negative for visual disturbance.  Respiratory:  Negative for cough and chest tightness.   Cardiovascular:  Negative for chest pain, palpitations and leg swelling.  Gastrointestinal:  Negative for  abdominal distention and abdominal pain.  Endocrine: Negative for polyuria.  Genitourinary:  Negative for difficulty urinating and dysuria.  Musculoskeletal:  Negative for arthralgias and back pain.  Skin: Negative.   Neurological: Negative.   Psychiatric/Behavioral: Negative.       Objective:    Physical Exam Vitals reviewed.  Constitutional:      Appearance: Normal appearance.  HENT:     Head: Normocephalic.     Right Ear: Tympanic membrane, ear canal and external ear normal.     Left Ear: Tympanic membrane, ear canal and external ear normal.     Nose: Nose normal.     Mouth/Throat:     Mouth: Mucous membranes are moist.     Pharynx: Oropharynx is clear.  Eyes:     Extraocular Movements: Extraocular movements  intact.     Conjunctiva/sclera: Conjunctivae normal.     Pupils: Pupils are equal, round, and reactive to light.  Cardiovascular:     Rate and Rhythm: Normal rate and regular rhythm.     Pulses: Normal pulses.     Heart sounds: Normal heart sounds. No murmur heard.   No gallop.  Pulmonary:     Effort: Pulmonary effort is normal. No respiratory distress.     Breath sounds: No wheezing.  Abdominal:     General: Abdomen is flat. Bowel sounds are normal. There is no distension.     Palpations: Abdomen is soft.     Tenderness: There is no abdominal tenderness.  Musculoskeletal:        General: Normal range of motion.     Cervical back: Normal range of motion and neck supple.  Skin:    General: Skin is warm and dry.     Capillary Refill: Capillary refill takes less than 2 seconds.  Neurological:     General: No focal deficit present.     Mental Status: She is alert and oriented to person, place, and time.  Psychiatric:        Mood and Affect: Mood normal.        Behavior: Behavior normal.    BP 124/80   Pulse 78   Temp (!) 97.4 F (36.3 C)   Ht 5' 3"  (1.6 m)   Wt 182 lb (82.6 kg)   SpO2 99%   BMI 32.24 kg/m  Wt Readings from Last 3 Encounters:  02/25/21 182 lb (82.6 kg)  01/02/21 179 lb (81.2 kg)  10/30/20 183 lb (83 kg)     There are no preventive care reminders to display for this patient.  There are no preventive care reminders to display for this patient.  Lab Results  Component Value Date   TSH 1.520 10/20/2020   Lab Results  Component Value Date   WBC CANCELED 10/20/2020   HGB CANCELED 10/20/2020   HCT CANCELED 10/20/2020   MCV 89 11/16/2019   PLT CANCELED 10/20/2020   Lab Results  Component Value Date   NA 140 10/20/2020   K 4.1 10/20/2020   CO2 22 10/20/2020   GLUCOSE 82 10/20/2020   BUN 11 10/20/2020   CREATININE 0.66 10/20/2020   BILITOT 0.2 10/20/2020   ALKPHOS 89 10/20/2020   AST 16 10/20/2020   ALT 15 10/20/2020   PROT 7.0 10/20/2020    ALBUMIN 4.1 10/20/2020   CALCIUM 9.3 10/20/2020   Lab Results  Component Value Date   CHOL 165 10/20/2020   Lab Results  Component Value Date   HDL 48 10/20/2020   Lab Results  Component  Value Date   LDLCALC 96 10/20/2020   Lab Results  Component Value Date   TRIG 118 10/20/2020   Lab Results  Component Value Date   CHOLHDL 3.4 10/20/2020   Lab Results  Component Value Date   HGBA1C 5.6 10/20/2020      Assessment & Plan:   Diagnoses and all orders for this visit: Screening mammogram for breast cancer Set up for mammogram routine  Routine general medical examination at a health care facility  Nursing physical performed, forms completed,     Follow-up: Return if symptoms worsen or fail to improve.    Reinaldo Meeker, MD

## 2021-02-27 ENCOUNTER — Ambulatory Visit (INDEPENDENT_AMBULATORY_CARE_PROVIDER_SITE_OTHER): Payer: 59

## 2021-02-27 ENCOUNTER — Ambulatory Visit
Admission: RE | Admit: 2021-02-27 | Discharge: 2021-02-27 | Disposition: A | Discharge status: Home / Self Care | Payer: 59 | Source: Ambulatory Visit | Attending: Legal Medicine | Admitting: Legal Medicine

## 2021-02-27 ENCOUNTER — Other Ambulatory Visit: Payer: Self-pay | Admitting: Legal Medicine

## 2021-02-27 DIAGNOSIS — R7611 Nonspecific reaction to tuberculin skin test without active tuberculosis: Secondary | ICD-10-CM

## 2021-02-27 DIAGNOSIS — Z111 Encounter for screening for respiratory tuberculosis: Secondary | ICD-10-CM

## 2021-02-27 LAB — TB SKIN TEST
Induration: 11 mm
TB Skin Test: POSITIVE

## 2021-02-27 NOTE — Addendum Note (Signed)
Addended by: Reinaldo Meeker on: 02/27/2021 07:59 AM   Modules accepted: Orders

## 2021-03-25 ENCOUNTER — Other Ambulatory Visit: Payer: Self-pay | Admitting: Legal Medicine

## 2021-03-25 ENCOUNTER — Ambulatory Visit (INDEPENDENT_AMBULATORY_CARE_PROVIDER_SITE_OTHER): Payer: 59

## 2021-03-25 DIAGNOSIS — R300 Dysuria: Secondary | ICD-10-CM | POA: Diagnosis not present

## 2021-03-25 DIAGNOSIS — N3090 Cystitis, unspecified without hematuria: Secondary | ICD-10-CM

## 2021-03-25 DIAGNOSIS — N3000 Acute cystitis without hematuria: Secondary | ICD-10-CM

## 2021-03-25 LAB — POCT URINALYSIS DIPSTICK
Bilirubin, UA: NEGATIVE
Blood, UA: NEGATIVE
Glucose, UA: NEGATIVE
Ketones, UA: NEGATIVE
Nitrite, UA: NEGATIVE
Protein, UA: NEGATIVE
Spec Grav, UA: 1.015 (ref 1.010–1.025)
Urobilinogen, UA: NEGATIVE E.U./dL — AB
pH, UA: 6 (ref 5.0–8.0)

## 2021-03-25 MED ORDER — SULFAMETHOXAZOLE-TRIMETHOPRIM 800-160 MG PO TABS: 1.0000 | ORAL_TABLET | Freq: Two times a day (BID) | ORAL | 0 refills | Status: DC

## 2021-03-27 LAB — URINE CULTURE

## 2021-03-27 NOTE — Progress Notes (Signed)
Negative culture urine lp

## 2021-06-18 ENCOUNTER — Encounter: Payer: Self-pay | Admitting: Legal Medicine

## 2021-06-18 ENCOUNTER — Ambulatory Visit (INDEPENDENT_AMBULATORY_CARE_PROVIDER_SITE_OTHER): Payer: 59

## 2021-06-18 DIAGNOSIS — Z23 Encounter for immunization: Secondary | ICD-10-CM

## 2021-08-03 ENCOUNTER — Other Ambulatory Visit: Payer: Self-pay | Admitting: Legal Medicine

## 2021-08-03 MED ORDER — AZITHROMYCIN 250 MG PO TABS: ORAL_TABLET | ORAL | 0 refills | Status: DC

## 2021-08-17 ENCOUNTER — Telehealth: Payer: 59 | Admitting: Family Medicine

## 2021-08-17 DIAGNOSIS — M791 Myalgia, unspecified site: Secondary | ICD-10-CM | POA: Insufficient documentation

## 2021-08-17 DIAGNOSIS — R509 Fever, unspecified: Secondary | ICD-10-CM | POA: Insufficient documentation

## 2021-08-17 DIAGNOSIS — J029 Acute pharyngitis, unspecified: Secondary | ICD-10-CM | POA: Insufficient documentation

## 2021-08-17 DIAGNOSIS — J988 Other specified respiratory disorders: Secondary | ICD-10-CM | POA: Insufficient documentation

## 2021-08-17 DIAGNOSIS — U071 COVID-19: Secondary | ICD-10-CM

## 2021-08-17 NOTE — Progress Notes (Unsigned)
Virtual Visit via Video Note   This visit type was conducted due to national recommendations for restrictions regarding the COVID-19 Pandemic (e.g. social distancing) in an effort to limit this patient's exposure and mitigate transmission in our community.  Due to her co-morbid illnesses, this patient is at least at moderate risk for complications without adequate follow up.  This format is felt to be most appropriate for this patient at this time.  All issues noted in this document were discussed and addressed.  A limited physical exam was performed with this format.  A verbal consent was obtained for the virtual visit.   Date:  08/17/2021   ID:  [name], DOB 11/29/1975, MRN [id number]  Patient Location: Home Provider Location: Office/Clinic  PCP:  Lillard Anes, MD   Evaluation Performed:  Follow-Up Visit  Chief Complaint:  ***  History of Present Illness:    [name] is a 45 y.o. female with fever, chills, coughing, sore throat, achy x 1 day.   The patient does have symptoms concerning for COVID-19 infection (fever, chills, cough, or new shortness of breath).   Past Medical History:  Diagnosis Date   AMA (advanced maternal age) multigravida 4+    Medical history non-contributory     Past Surgical History:  Procedure Laterality Date   CESAREAN SECTION  2016   NO PAST SURGERIES      Family History  Problem Relation Age of Onset   Diabetes Mother    High blood pressure Mother    Diabetes Father    High blood pressure Father    Diabetes Paternal Grandmother    Diabetes Paternal Grandfather    High blood pressure Sister     Social History   Socioeconomic History   Marital status: Single    Spouse name: Not on file   Number of children: 1   Years of education: Not on file   Highest education level: Bachelor's degree (e.g., BA, AB, BS)  Occupational History   Occupation: Psychologist, sport and exercise  Tobacco Use   Smoking status: Never    Smokeless tobacco: Never  Vaping Use   Vaping Use: Never used  Substance and Sexual Activity   Alcohol use: Yes    Alcohol/week: 2.0 standard drinks    Types: 2 Cans of beer per week    Comment: BEER - WEEKENDS   Drug use: Never   Sexual activity: Yes    Comment: 1ST INTERCOURSE- 55, PARTNERS- 7  Other Topics Concern   Not on file  Social History Narrative   Not on file   Social Determinants of Health   Financial Resource Strain: Not on file  Food Insecurity: Not on file  Transportation Needs: Not on file  Physical Activity: Not on file  Stress: Not on file  Social Connections: Not on file  Intimate Partner Violence: Not on file    Outpatient Medications Prior to Visit  Medication Sig Dispense Refill   sulfamethoxazole-trimethoprim (BACTRIM DS) 800-160 MG tablet Take 1 tablet by mouth 2 (two) times daily. 14 tablet 0   No facility-administered medications prior to visit.    Allergies:   Patient has no known allergies.   Social History   Tobacco Use   Smoking status: Never   Smokeless tobacco: Never  Vaping Use   Vaping Use: Never used  Substance Use Topics   Alcohol use: Yes    Alcohol/week: 2.0 standard drinks    Types: 2 Cans of beer per week  Comment: BEER - WEEKENDS   Drug use: Never     Review of Systems  Constitutional:  Positive for chills, fever and malaise/fatigue.  HENT:  Positive for sore throat. Negative for ear pain and sinus pain.   Respiratory:  Negative for cough and shortness of breath.   Cardiovascular:  Negative for chest pain.  Musculoskeletal:  Positive for myalgias.  Neurological:  Negative for headaches.    Labs/Other Tests and Data Reviewed:    Recent Labs: 10/20/2020: ALT 15; BUN 11; Creatinine, Ser 0.66; Hemoglobin CANCELED; Platelets CANCELED; Potassium 4.1; Sodium 140; TSH 1.520   Recent Lipid Panel Lab Results  Component Value Date/Time   CHOL 165 10/20/2020 01:27 PM   TRIG 118 10/20/2020 01:27 PM   HDL 48 10/20/2020  01:27 PM   CHOLHDL 3.4 10/20/2020 01:27 PM   LDLCALC 96 10/20/2020 01:27 PM    Wt Readings from Last 3 Encounters:  02/25/21 182 lb (82.6 kg)  01/02/21 179 lb (81.2 kg)  10/30/20 183 lb (83 kg)     Objective:    Vital Signs:  There were no vitals taken for this visit.   Physical Exam   ASSESSMENT & PLAN:    Problem List Items Addressed This Visit       Respiratory   Respiratory tract infection due to COVID-19 virus - Primary     Other   Myalgia   Sorethroat   Fever      COVID-19 Education: The signs and symptoms of COVID-19 were discussed with the patient and how to seek care for testing (follow up with PCP or arrange E-visit). The importance of social distancing was discussed today.   I spent < time > minutes dedicated to the care of this patient on the date of this encounter to include face-to-face time with the patient, as well as: ***  Follow Up:  {F/U [phone]} {follow up:15908}  Signed, Rochel Brome, MD  08/17/2021 1:15 PM    [place]

## 2021-09-15 ENCOUNTER — Ambulatory Visit: Payer: 59 | Admitting: Nurse Practitioner

## 2021-09-26 ENCOUNTER — Other Ambulatory Visit: Payer: Self-pay | Admitting: Nurse Practitioner

## 2021-09-26 DIAGNOSIS — Z3045 Encounter for surveillance of transdermal patch hormonal contraceptive device: Secondary | ICD-10-CM

## 2021-09-28 NOTE — Telephone Encounter (Signed)
Annual exam scheduled on 12/03/21

## 2021-10-12 ENCOUNTER — Encounter: Payer: Self-pay | Admitting: Legal Medicine

## 2021-10-12 ENCOUNTER — Ambulatory Visit (INDEPENDENT_AMBULATORY_CARE_PROVIDER_SITE_OTHER): Payer: Self-pay | Admitting: Legal Medicine

## 2021-10-12 VITALS — BP 128/82 | HR 74 | Temp 98.5°F | Ht 63.0 in | Wt 178.0 lb

## 2021-10-12 DIAGNOSIS — E669 Obesity, unspecified: Secondary | ICD-10-CM

## 2021-10-12 DIAGNOSIS — E559 Vitamin D deficiency, unspecified: Secondary | ICD-10-CM

## 2021-10-12 DIAGNOSIS — Z0000 Encounter for general adult medical examination without abnormal findings: Secondary | ICD-10-CM

## 2021-10-12 NOTE — Progress Notes (Signed)
Established Patient Office Visit  Subjective:  Patient ID: [name], female    DOB: 09/13/1975  Age: 46 y.o. MRN: [id number]  CC:  Chief Complaint  Patient presents with   Annual Exam    HPI [name] presents for exam. She is doing well on vitamin D.  Patient is concerned that she has had recurrent infections in the past year and has been tired most of the time.  Worried about her deficiency that could be causing this.  She is also had some hair loss recently but had COVID 2 months ago.  We discussed this.   Past Medical History:  Diagnosis Date   AMA (advanced maternal age) multigravida 31+    Medical history non-contributory     Past Surgical History:  Procedure Laterality Date   CESAREAN SECTION  2016   NO PAST SURGERIES      Family History  Problem Relation Age of Onset   Diabetes Mother    High blood pressure Mother    Diabetes Father    High blood pressure Father    Diabetes Paternal Grandmother    Diabetes Paternal Grandfather    High blood pressure Sister     Social History   Socioeconomic History   Marital status: Single    Spouse name: Not on file   Number of children: 1   Years of education: Not on file   Highest education level: Bachelor's degree (e.g., BA, AB, BS)  Occupational History   Occupation: Psychologist, sport and exercise  Tobacco Use   Smoking status: Never   Smokeless tobacco: Never  Vaping Use   Vaping Use: Never used  Substance and Sexual Activity   Alcohol use: Yes    Alcohol/week: 2.0 standard drinks    Types: 2 Cans of beer per week    Comment: BEER - WEEKENDS   Drug use: Never   Sexual activity: Yes    Comment: 1ST INTERCOURSE- 17, PARTNERS- 7  Other Topics Concern   Not on file  Social History Narrative   Not on file   Social Determinants of Health   Financial Resource Strain: Not on file  Food Insecurity: Not on file  Transportation Needs: Not on file  Physical Activity: Not on file  Stress: Not on file   Social Connections: Not on file  Intimate Partner Violence: Not on file    Outpatient Medications Prior to Visit  Medication Sig Dispense Refill   norelgestromin-ethinyl estradiol Marilu Favre) 150-35 MCG/24HR transdermal patch Place 1 patch onto the skin once a week. 9 patch 0   Vitamin D, Ergocalciferol, (DRISDOL) 1.25 MG (50000 UNIT) CAPS capsule Take 50,000 Units by mouth once a week.     sulfamethoxazole-trimethoprim (BACTRIM DS) 800-160 MG tablet Take 1 tablet by mouth 2 (two) times daily. 14 tablet 0   No facility-administered medications prior to visit.    No Known Allergies  ROS Review of Systems  Constitutional:  Negative for activity change and appetite change.  HENT:  Negative for congestion.   Eyes:  Negative for visual disturbance.  Respiratory:  Negative for chest tightness and shortness of breath.   Cardiovascular:  Negative for chest pain and palpitations.  Gastrointestinal:  Negative for abdominal distention.  Endocrine: Negative for polyuria.  Genitourinary:  Negative for difficulty urinating.  Musculoskeletal:  Negative for arthralgias.  Neurological: Negative.   Psychiatric/Behavioral: Negative.       Objective:    Physical Exam Vitals reviewed.  Constitutional:      General: She  is not in acute distress.    Appearance: Normal appearance.  HENT:     Head: Normocephalic.     Right Ear: Tympanic membrane normal.     Left Ear: Tympanic membrane normal.     Nose: Nose normal.     Mouth/Throat:     Mouth: Mucous membranes are moist.     Pharynx: Oropharynx is clear.  Eyes:     Conjunctiva/sclera: Conjunctivae normal.     Pupils: Pupils are equal, round, and reactive to light.  Cardiovascular:     Rate and Rhythm: Normal rate and regular rhythm.     Pulses: Normal pulses.     Heart sounds: Normal heart sounds. No murmur heard.   No gallop.  Pulmonary:     Effort: Pulmonary effort is normal. No respiratory distress.     Breath sounds: Normal breath  sounds. No wheezing.  Abdominal:     General: Bowel sounds are normal. There is no distension.     Palpations: Abdomen is soft.     Tenderness: There is no abdominal tenderness.  Musculoskeletal:        General: Normal range of motion.     Cervical back: Normal range of motion.     Right lower leg: No edema.     Left lower leg: No edema.  Skin:    General: Skin is warm.     Capillary Refill: Capillary refill takes less than 2 seconds.  Neurological:     General: No focal deficit present.     Mental Status: She is alert and oriented to person, place, and time. Mental status is at baseline.     Gait: Gait normal.     Deep Tendon Reflexes: Reflexes normal.  Psychiatric:        Mood and Affect: Mood normal.        Behavior: Behavior normal.        Judgment: Judgment normal.    BP 128/82 (BP Location: Right Arm, Patient Position: Sitting)    Pulse 74    Temp 98.5 F (36.9 C) (Oral)    Ht 5' 3"  (1.6 m)    Wt 178 lb (80.7 kg)    SpO2 100%    BMI 31.53 kg/m  Wt Readings from Last 3 Encounters:  10/12/21 178 lb (80.7 kg)  02/25/21 182 lb (82.6 kg)  01/02/21 179 lb (81.2 kg)     Health Maintenance Due  Topic Date Due   MAMMOGRAM  11/30/2020   COLONOSCOPY (Pts 45-87yr Insurance coverage will need to be confirmed)  Never done    There are no preventive care reminders to display for this patient.  Lab Results  Component Value Date   TSH 1.520 10/20/2020   Lab Results  Component Value Date   WBC CANCELED 10/20/2020   HGB CANCELED 10/20/2020   HCT CANCELED 10/20/2020   MCV 89 11/16/2019   PLT CANCELED 10/20/2020   Lab Results  Component Value Date   NA 140 10/20/2020   K 4.1 10/20/2020   CO2 22 10/20/2020   GLUCOSE 82 10/20/2020   BUN 11 10/20/2020   CREATININE 0.66 10/20/2020   BILITOT 0.2 10/20/2020   ALKPHOS 89 10/20/2020   AST 16 10/20/2020   ALT 15 10/20/2020   PROT 7.0 10/20/2020   ALBUMIN 4.1 10/20/2020   CALCIUM 9.3 10/20/2020   Lab Results  Component  Value Date   CHOL 165 10/20/2020   Lab Results  Component Value Date   HDL 48 10/20/2020  Lab Results  Component Value Date   LDLCALC 96 10/20/2020   Lab Results  Component Value Date   TRIG 118 10/20/2020   Lab Results  Component Value Date   CHOLHDL 3.4 10/20/2020   Lab Results  Component Value Date   HGBA1C 5.6 10/20/2020      Assessment & Plan:   Problem List Items Addressed This Visit       Other   Routine general medical examination at a health care facility   Relevant Orders   CBC with Differential/Platelet   Comprehensive metabolic panel   Lipid panel   TSH   Hemoglobin A1c   Vitamin B12 Patient remains healthy and no abnormalities were found.  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    Vitamin D deficiency - Primary   Relevant Orders   VITAMIN D 25 Hydroxy (Vit-D Deficiency, Fractures) Patient has vitamin D deficiency and is started on supplemental vitamin D and wants to see her level.   Obesity (BMI 30.0-34.9) Patient is remaining active and cutting back on her diet to try to lose weight.      Follow-up: Return if symptoms worsen or fail to improve.    Reinaldo Meeker, MD

## 2021-10-14 LAB — COMPREHENSIVE METABOLIC PANEL
ALT: 6 IU/L (ref 0–32)
AST: 10 IU/L (ref 0–40)
Albumin/Globulin Ratio: 1.5 (ref 1.2–2.2)
Albumin: 4.1 g/dL (ref 3.8–4.8)
Alkaline Phosphatase: 73 IU/L (ref 44–121)
BUN/Creatinine Ratio: 9 (ref 9–23)
BUN: 8 mg/dL (ref 6–24)
Bilirubin Total: 0.3 mg/dL (ref 0.0–1.2)
CO2: 22 mmol/L (ref 20–29)
Calcium: 9 mg/dL (ref 8.7–10.2)
Chloride: 106 mmol/L (ref 96–106)
Creatinine, Ser: 0.89 mg/dL (ref 0.57–1.00)
Globulin, Total: 2.7 g/dL (ref 1.5–4.5)
Glucose: 78 mg/dL (ref 70–99)
Potassium: 4.5 mmol/L (ref 3.5–5.2)
Sodium: 140 mmol/L (ref 134–144)
Total Protein: 6.8 g/dL (ref 6.0–8.5)
eGFR: 81 mL/min/{1.73_m2} (ref >59)

## 2021-10-14 LAB — CBC WITH DIFFERENTIAL/PLATELET
Basophils Absolute: 0.1 10*3/uL (ref 0.0–0.2)
Basos: 1 %
EOS (ABSOLUTE): 0.3 10*3/uL (ref 0.0–0.4)
Eos: 4 %
Hematocrit: 37 % (ref 34.0–46.6)
Hemoglobin: 12.4 g/dL (ref 11.1–15.9)
Immature Grans (Abs): 0 10*3/uL (ref 0.0–0.1)
Immature Granulocytes: 0 %
Lymphocytes Absolute: 3.7 10*3/uL — ABNORMAL HIGH (ref 0.7–3.1)
Lymphs: 44 %
MCH: 28.9 pg (ref 26.6–33.0)
MCHC: 33.5 g/dL (ref 31.5–35.7)
MCV: 86 fL (ref 79–97)
Monocytes Absolute: 0.5 10*3/uL (ref 0.1–0.9)
Monocytes: 6 %
Neutrophils Absolute: 3.6 10*3/uL (ref 1.4–7.0)
Neutrophils: 45 %
Platelets: 300 10*3/uL (ref 150–450)
RBC: 4.29 x10E6/uL (ref 3.77–5.28)
RDW: 12.4 % (ref 11.7–15.4)
WBC: 8.1 10*3/uL (ref 3.4–10.8)

## 2021-10-14 LAB — TSH: TSH: 2.55 u[IU]/mL (ref 0.450–4.500)

## 2021-10-14 LAB — LIPID PANEL
Chol/HDL Ratio: 3.2 ratio (ref 0.0–4.4)
Cholesterol, Total: 152 mg/dL (ref 100–199)
HDL: 47 mg/dL (ref >39)
LDL Chol Calc (NIH): 89 mg/dL (ref 0–99)
Triglycerides: 83 mg/dL (ref 0–149)
VLDL Cholesterol Cal: 16 mg/dL (ref 5–40)

## 2021-10-14 LAB — VITAMIN B12: Vitamin B-12: 199 pg/mL — ABNORMAL LOW (ref 232–1245)

## 2021-10-14 LAB — HEMOGLOBIN A1C
Est. average glucose Bld gHb Est-mCnc: 111 mg/dL
Hgb A1c MFr Bld: 5.5 % (ref 4.8–5.6)

## 2021-10-14 LAB — CARDIOVASCULAR RISK ASSESSMENT

## 2021-10-14 NOTE — Progress Notes (Signed)
Wbc OK, B12 low, start oral supplement, kidney and liver tests normal, cholesterol normal, thyroid normal lp

## 2021-11-20 ENCOUNTER — Other Ambulatory Visit: Payer: Self-pay | Admitting: Legal Medicine

## 2021-11-20 DIAGNOSIS — J0100 Acute maxillary sinusitis, unspecified: Secondary | ICD-10-CM

## 2021-11-20 MED ORDER — ERYTHROMYCIN BASE 500 MG PO TABS: 500.0000 mg | ORAL_TABLET | Freq: Every day | ORAL | 0 refills | Status: DC

## 2021-11-21 ENCOUNTER — Other Ambulatory Visit: Payer: Self-pay | Admitting: Legal Medicine

## 2021-11-21 DIAGNOSIS — J0100 Acute maxillary sinusitis, unspecified: Secondary | ICD-10-CM

## 2021-11-21 MED ORDER — AZITHROMYCIN 500 MG PO TABS: 500.0000 mg | ORAL_TABLET | Freq: Every day | ORAL | 0 refills | Status: DC

## 2021-12-03 ENCOUNTER — Encounter: Payer: Self-pay | Admitting: Nurse Practitioner

## 2021-12-03 ENCOUNTER — Ambulatory Visit (INDEPENDENT_AMBULATORY_CARE_PROVIDER_SITE_OTHER): Payer: No Typology Code available for payment source | Admitting: Nurse Practitioner

## 2021-12-03 VITALS — BP 124/70 | Ht 62.0 in | Wt 178.0 lb

## 2021-12-03 DIAGNOSIS — Z3045 Encounter for surveillance of transdermal patch hormonal contraceptive device: Secondary | ICD-10-CM | POA: Diagnosis not present

## 2021-12-03 DIAGNOSIS — Z01419 Encounter for gynecological examination (general) (routine) without abnormal findings: Secondary | ICD-10-CM | POA: Diagnosis not present

## 2021-12-03 DIAGNOSIS — Z1211 Encounter for screening for malignant neoplasm of colon: Secondary | ICD-10-CM

## 2021-12-03 MED ORDER — XULANE 150-35 MCG/24HR TD PTWK: 1.0000 | MEDICATED_PATCH | TRANSDERMAL | 4 refills | Status: DC

## 2021-12-03 NOTE — Progress Notes (Signed)
? ?[name] 11/08/1975 [id number] ? ? ?History:  46 y.o. G1P1001 presents for annual exam. Monthly cycles. Normal pap and mammogram history.  ? ?Gynecologic History ?Patient's last menstrual period was 11/19/2021. ?Period Cycle (Days): 28 ?Period Duration (Days): 4 ?Period Pattern: Regular ?Menstrual Flow: Moderate, Light ?Dysmenorrhea: (!) Mild ?Dysmenorrhea Symptoms: Cramping ?Contraception: Ortho-Evra patches weekly ?Sexually active: Yes ? ?Health maintenance ?Last Pap: 09/04/2019. Results were: Normal, 5-year repeat ?Last mammogram: 12/01/2018. Results were: Normal ?Last colonoscopy: Never ?Last Dexa: Never ? ?Past medical history, past surgical history, family history and social history were all reviewed and documented in the EPIC chart. 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 20 yo daughter, in 22 at [place]. Significant family history of diabetes and HTN. Mother and grandmother with osteoporosis.  ? ?ROS:  A ROS was performed and pertinent positives and negatives are included. ? ?Exam: ? ?Vitals:  ? 12/03/21 1029  ?BP: 124/70  ?Weight: 178 lb (80.7 kg)  ?Height: 5' 2"  (1.575 m)  ? ? ?Body mass index is 32.56 kg/m?. ? ?General appearance:  Normal ?Thyroid:  Symmetrical, normal in size, without palpable masses or nodularity. ?Respiratory ? Auscultation:  Clear without wheezing or rhonchi ?Cardiovascular ? Auscultation:  Regular rate, without rubs, murmurs or gallops ? Edema/varicosities:  Not grossly evident ?Abdominal ? Soft,nontender, without masses, guarding or rebound. ? Liver/spleen:  No organomegaly noted ? Hernia:  None appreciated ? Skin ? Inspection:  Grossly normal ?  ?Breasts: Examined lying and sitting.  ? Right: Without masses, retractions, discharge or axillary adenopathy. ? ? Left: Without masses, retractions, discharge or axillary adenopathy. ?Genitourinary  ? Inguinal/mons:  Normal without inguinal adenopathy ? External genitalia:  Normal appearing vulva with no  masses, tenderness, or lesions ? BUS/Urethra/Skene's glands:  Normal ? Vagina:  Normal appearing with normal color and discharge, no lesions ? Cervix:  Normal appearing without discharge or lesions ? Uterus:  Normal in size, shape and contour.  Midline and mobile, nontender ? Adnexa/parametria:   ?  Rt: Normal in size, without masses or tenderness. ?  Lt: Normal in size, without masses or tenderness. ? Anus and perineum: Normal ? Digital rectal exam: Normal sphincter tone without palpated masses or tenderness ? ?Patient informed chaperone available to be present for breast and pelvic exam. Patient has requested no chaperone to be present. Patient has been advised what will be completed during breast and pelvic exam.  ? ?Assessment/Plan:  46 y.o. G1P1001 for annual exam.  ?  ?Well woman exam with routine gynecological exam - Education provided on SBEs, importance of preventative screenings, current guidelines, high calcium diet, regular exercise, and multivitamin daily. Labs with PCP.  ? ?Encounter for surveillance of transdermal patch hormonal contraceptive device - Plan: norelgestromin-ethinyl estradiol (ORTHO EVRA) 150-35 MCG/24HR transdermal patch. Taking as prescribed. Refill x 1 year provided.  ? ?Screening for colon cancer - Plan: Cologuard. No family hx of colon cancer or personal risk factors. Candidate for Cologuard and she is agreeable.  ? ?Screening for cervical cancer - Normal Pap history.  Will repeat at 5-year interval per guidelines. ? ?Screening for breast cancer - Normal mammogram history. Overdue for mammogram. Discussed current guidelines and importance of preventative screenings. 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 Normal breast exam today. ? ?Screening for osteoporosis - mother and grandmother with osteoporosis. Will plan for DXA at age 64. H/O vitamin D deficiency, on weekly supplementation prescribed by PCP. Recommend high calcium diet or supplementat and increase exercise.  ? ?  Follow  up in 1 year for annual.  ? ? ? ? ?Tamela Gammon [facility], 10:59 AM 12/03/2021 ? ?

## 2021-12-09 ENCOUNTER — Other Ambulatory Visit: Payer: Self-pay

## 2021-12-09 DIAGNOSIS — Z1231 Encounter for screening mammogram for malignant neoplasm of breast: Secondary | ICD-10-CM

## 2021-12-11 ENCOUNTER — Other Ambulatory Visit: Payer: Self-pay | Admitting: Legal Medicine

## 2021-12-11 DIAGNOSIS — Z1231 Encounter for screening mammogram for malignant neoplasm of breast: Secondary | ICD-10-CM

## 2021-12-20 LAB — COLOGUARD: COLOGUARD: NEGATIVE

## 2021-12-30 ENCOUNTER — Ambulatory Visit
Admission: RE | Admit: 2021-12-30 | Discharge: 2021-12-30 | Disposition: A | Discharge status: Home / Self Care | Payer: PRIVATE HEALTH INSURANCE | Source: Ambulatory Visit | Attending: Legal Medicine | Admitting: Legal Medicine

## 2021-12-30 DIAGNOSIS — Z1231 Encounter for screening mammogram for malignant neoplasm of breast: Secondary | ICD-10-CM

## 2021-12-31 NOTE — Progress Notes (Signed)
Normal mammogram ?lp

## 2022-02-19 ENCOUNTER — Ambulatory Visit (INDEPENDENT_AMBULATORY_CARE_PROVIDER_SITE_OTHER): Payer: PRIVATE HEALTH INSURANCE | Admitting: Nurse Practitioner

## 2022-02-19 ENCOUNTER — Encounter: Payer: Self-pay | Admitting: Nurse Practitioner

## 2022-02-19 VITALS — BP 112/78 | HR 101 | Temp 99.0°F | Wt 180.0 lb

## 2022-02-19 DIAGNOSIS — J029 Acute pharyngitis, unspecified: Secondary | ICD-10-CM | POA: Diagnosis not present

## 2022-02-19 DIAGNOSIS — J00 Acute nasopharyngitis [common cold]: Secondary | ICD-10-CM

## 2022-02-19 MED ORDER — IBUPROFEN 600 MG PO TABS: 600.0000 mg | ORAL_TABLET | Freq: Three times a day (TID) | ORAL | 1 refills | Status: DC | PRN

## 2022-02-19 MED ORDER — AZITHROMYCIN 500 MG PO TABS: 500.0000 mg | ORAL_TABLET | Freq: Every day | ORAL | 0 refills | Status: DC

## 2022-02-19 NOTE — Progress Notes (Signed)
   Acute Office Visit  Subjective:     Patient ID: [name], female    DOB: 09-Oct-1975, 46 y.o.   MRN: [id number]  Chief Complaint  Patient presents with   Sore Throat    Sore Throat    Patient is in today for sore throat, chills, body aches, and fatigue. She denies fever, rash, or cough. Onset of symptoms was yesterday. Treatment has included Ibuprofen and pushing fluids. She was recently exposed to an ill co-worker that had URI symptoms.   ROS See pertinent positives and negatives per HPI.      Objective:    BP 112/78   Pulse (!) 101   Temp 99 F (37.2 C) (Oral)   Wt 180 lb (81.6 kg)   SpO2 97%   BMI 32.92 kg/m    Physical Exam Vitals reviewed.  Constitutional:      Appearance: She is ill-appearing.  HENT:     Right Ear: Tympanic membrane normal.     Left Ear: Tympanic membrane normal.     Nose: Congestion present. No rhinorrhea.     Mouth/Throat:     Pharynx: Posterior oropharyngeal erythema present.  Cardiovascular:     Rate and Rhythm: Regular rhythm. Tachycardia present.  Pulmonary:     Effort: Pulmonary effort is normal.     Breath sounds: Normal breath sounds.  Abdominal:     General: Bowel sounds are normal.     Palpations: Abdomen is soft.  Lymphadenopathy:     Cervical: Cervical adenopathy present.  Skin:    General: Skin is warm and dry.     Capillary Refill: Capillary refill takes less than 2 seconds.  Neurological:     General: No focal deficit present.     Mental Status: She is alert and oriented to person, place, and time.           Assessment & Plan:   1. Rhinopharyngitis - azithromycin (ZITHROMAX) 500 MG tablet; Take 1 tablet (500 mg total) by mouth daily.  Dispense: 3 tablet; Refill: 0  2. Sore throat - ibuprofen (ADVIL) 600 MG tablet; Take 1 tablet (600 mg total) by mouth every 8 (eight) hours as needed.  Dispense: 90 tablet; Refill: 1     Take Azithromycin 500 mg daily as prescribed Rest and push fluids Take  Ibuprofen as directed for pain Warm salt water gargles as needed Replace toothbrush Follow-up as needed   Follow-up: As needed  I, Rip Harbour, NP, have reviewed all documentation for this visit. The documentation on 02/19/22 for the exam, diagnosis, procedures, and orders are all accurate and complete.   Signed, Rip Harbour, NP

## 2022-02-19 NOTE — Patient Instructions (Addendum)
Take Azithromycin 500 mg daily as prescribed Rest and push fluids Take Ibuprofen as directed for pain Warm salt water gargles as needed Replace toothbrush Follow-up as needed  Pharyngitis  Pharyngitis is a sore throat (pharynx). This is when there is redness, pain, and swelling in your throat. Most of the time, this condition gets better on its own. In some cases, you may need medicine. What are the causes? An infection from a virus. An infection from bacteria. Allergies. What increases the risk? Being 56-62 years old. Being in crowded environments. These include: Daycares. Schools. Dormitories. Living in a place with cold temperatures outside. Having a weakened disease-fighting (immune) system. What are the signs or symptoms? Symptoms may vary depending on the cause. Common symptoms include: Sore throat. Tiredness (fatigue). Low-grade fever. Stuffy nose. Cough. Headache. Other symptoms may include: Glands in the neck (lymph nodes) that are swollen. Skin rashes. Film on the throat or tonsils. This can be caused by an infection from bacteria. Vomiting. Red, itchy eyes. Loss of appetite. Joint pain and muscle aches. Tonsils that are temporarily bigger than usual (enlarged). How is this treated? Many times, treatment is not needed. This condition usually gets better in 3-4 days without treatment. If the infection is caused by a bacteria, you may be need to take antibiotics. Follow these instructions at home: Medicines Take over-the-counter and prescription medicines only as told by your doctor. If you were prescribed an antibiotic medicine, take it as told by your doctor. Do not stop taking the antibiotic even if you start to feel better. Use throat lozenges or sprays to soothe your throat as told by your doctor. Children can get pharyngitis. Do not give your child aspirin. Managing pain To help with pain, try: Sipping warm liquids, such as: Broth. Herbal tea. Warm  water. Eating or drinking cold or frozen liquids, such as frozen ice pops. Rinsing your mouth (gargle) with a salt water mixture 3-4 times a day or as needed. To make salt water, dissolve -1 tsp (3-6 g) of salt in 1 cup (237 mL) of warm water. Do not swallow this mixture. Sucking on hard candy or throat lozenges. Putting a cool-mist humidifier in your bedroom at night to moisten the air. Sitting in the bathroom with the door closed for 5-10 minutes while you run hot water in the shower.  General instructions  Do not smoke or use any products that contain nicotine or tobacco. If you need help quitting, ask your doctor. Rest as told by your doctor. Drink enough fluid to keep your pee (urine) pale yellow. How is this prevented? Wash your hands often for at least 20 seconds with soap and water. If soap and water are not available, use hand sanitizer. Do not touch your eyes, nose, or mouth with unwashed hands. Wash hands after touching these areas. Do not share cups or eating utensils. Avoid close contact with people who are sick. Contact a doctor if: You have large, tender lumps in your neck. You have a rash. You cough up green, yellow-brown, or bloody spit. Get help right away if: You have a stiff neck. You drool or cannot swallow liquids. You cannot drink or take medicines without vomiting. You have very bad pain that does not go away with medicine. You have problems breathing, and it is not from a stuffy nose. You have new pain and swelling in your knees, ankles, wrists, or elbows. These symptoms may be an emergency. Get help right away. Call your local emergency services (911  in the [place] Do not wait to see if the symptoms will go away. Do not drive yourself to the hospital. Summary Pharyngitis is a sore throat (pharynx). This is when there is redness, pain, and swelling in your throat. Most of the time, pharyngitis gets better on its own. Sometimes, you may need medicine. If  you were prescribed an antibiotic medicine, take it as told by your doctor. Do not stop taking the antibiotic even if you start to feel better. This information is not intended to replace advice given to you by your health care provider. Make sure you discuss any questions you have with your health care provider. Document Revised: 11/19/2020 Document Reviewed: 11/19/2020 Elsevier Patient Education  [place].

## 2022-03-04 ENCOUNTER — Encounter: Payer: Self-pay | Admitting: Family Medicine

## 2022-03-04 ENCOUNTER — Ambulatory Visit (INDEPENDENT_AMBULATORY_CARE_PROVIDER_SITE_OTHER): Payer: PRIVATE HEALTH INSURANCE | Admitting: Family Medicine

## 2022-03-04 VITALS — BP 120/80 | HR 74 | Temp 97.0°F | Ht 63.0 in | Wt 180.0 lb

## 2022-03-04 DIAGNOSIS — E538 Deficiency of other specified B group vitamins: Secondary | ICD-10-CM | POA: Diagnosis not present

## 2022-03-04 DIAGNOSIS — E559 Vitamin D deficiency, unspecified: Secondary | ICD-10-CM

## 2022-03-04 DIAGNOSIS — R5383 Other fatigue: Secondary | ICD-10-CM

## 2022-03-04 DIAGNOSIS — L608 Other nail disorders: Secondary | ICD-10-CM | POA: Insufficient documentation

## 2022-03-04 DIAGNOSIS — H9192 Unspecified hearing loss, left ear: Secondary | ICD-10-CM | POA: Diagnosis not present

## 2022-03-04 NOTE — Assessment & Plan Note (Signed)
Check labs 

## 2022-03-04 NOTE — Assessment & Plan Note (Signed)
Order MRI of brain and IAC. Refer to ENT. Audiometry completed in our office and was abnormal.

## 2022-03-04 NOTE — Assessment & Plan Note (Signed)
Check B12 level.

## 2022-03-04 NOTE — Assessment & Plan Note (Signed)
Check vitamin D level.

## 2022-03-04 NOTE — Progress Notes (Signed)
Subjective:  Patient ID: [name], female    DOB: [birth year]  Age: 46 y.o. MRN: [id number]  Chief Complaint  Patient presents with   Decreased Hearing   Fatigue    HPI Patient presents today for hearing loss in her left ear was seen at [facility] 2014 for her hearing loss, patient states anatomically nothing is wrong and recommended a MRI. She would like to proceed with having a MRI.  Patient would also like to have her b12 re checked due to fatigue.   Typically averages 6 hours of interrupted sleep every night. Her dog awakens her.   Xanthelasma below both eyes.   Current Outpatient Medications on File Prior to Visit  Medication Sig Dispense Refill   ibuprofen (ADVIL) 600 MG tablet Take 1 tablet (600 mg total) by mouth every 8 (eight) hours as needed. 90 tablet 1   norelgestromin-ethinyl estradiol Marilu Favre) 150-35 MCG/24HR transdermal patch Place 1 patch onto the skin once a week. 9 patch 4   Vitamin D, Ergocalciferol, (DRISDOL) 1.25 MG (50000 UNIT) CAPS capsule Take 50,000 Units by mouth once a week.     No current facility-administered medications on file prior to visit.   Past Medical History:  Diagnosis Date   AMA (advanced maternal age) multigravida 61+    Medical history non-contributory    Past Surgical History:  Procedure Laterality Date   CESAREAN SECTION  2016   NO PAST SURGERIES      Family History  Problem Relation Age of Onset   Diabetes Mother    High blood pressure Mother    Pulmonary fibrosis Mother    Diabetes Father    High blood pressure Father    High blood pressure Sister    Diabetes Paternal Grandmother    Diabetes Paternal Grandfather    Breast cancer Neg Hx    Social History   Socioeconomic History   Marital status: Single    Spouse name: Not on file   Number of children: 1   Years of education: Not on file   Highest education level: Bachelor's degree (e.g., BA, AB, BS)  Occupational History   Occupation: Psychologist, sport and exercise  Tobacco  Use   Smoking status: Never   Smokeless tobacco: Never  Vaping Use   Vaping Use: Never used  Substance and Sexual Activity   Alcohol use: Yes    Alcohol/week: 2.0 standard drinks of alcohol    Types: 2 Cans of beer per week    Comment: BEER - OCCAS.WEEKENDS   Drug use: Never   Sexual activity: Yes    Birth control/protection: Patch    Comment: 1ST INTERCOURSE- 82, PARTNERS- 7  Other Topics Concern   Not on file  Social History Narrative   Not on file   Social Determinants of Health   Financial Resource Strain: Not on file  Food Insecurity: Not on file  Transportation Needs: Not on file  Physical Activity: Not on file  Stress: Not on file  Social Connections: Not on file    Review of Systems  Constitutional:  Positive for fatigue. Negative for chills and fever.  HENT:  Positive for hearing loss (Left). Negative for congestion, ear pain, postnasal drip, rhinorrhea, sinus pressure, sinus pain and sore throat.   Respiratory:  Negative for cough and shortness of breath.   Cardiovascular:  Negative for chest pain.  Gastrointestinal:  Negative for diarrhea and nausea.  Neurological:  Negative for dizziness and headaches.     Objective:  BP 120/80  Pulse 74   Temp (!) 97 F (36.1 C)   Ht 5' 3"  (1.6 m)   Wt 180 lb (81.6 kg)   SpO2 99%   BMI 31.89 kg/m      03/04/2022    7:49 AM 02/19/2022    8:22 AM 12/03/2021   10:29 AM  BP/Weight  Systolic BP 563 875 643  Diastolic BP 80 78 70  Wt. (Lbs) 180 180 178  BMI 31.89 kg/m2 32.92 kg/m2 32.56 kg/m2    Physical Exam Vitals reviewed.  Constitutional:      Appearance: Normal appearance.  HENT:     Right Ear: Tympanic membrane, ear canal and external ear normal.     Left Ear: Tympanic membrane, ear canal and external ear normal.     Nose: Nose normal.     Mouth/Throat:     Pharynx: Oropharynx is clear.  Cardiovascular:     Rate and Rhythm: Normal rate and regular rhythm.     Heart sounds: Normal heart sounds. No  murmur heard. Pulmonary:     Effort: Pulmonary effort is normal. No respiratory distress.     Breath sounds: Normal breath sounds.  Lymphadenopathy:     Cervical: No cervical adenopathy.  Skin:    Comments: Xanthelasma under BL eyes  Neurological:     Mental Status: She is alert and oriented to person, place, and time.  Psychiatric:        Mood and Affect: Mood normal.        Behavior: Behavior normal.     Diabetic Foot Exam - Simple   No data filed      Lab Results  Component Value Date   WBC 8.1 10/13/2021   HGB 12.4 10/13/2021   HCT 37.0 10/13/2021   PLT 300 10/13/2021   GLUCOSE 78 10/13/2021   CHOL 152 10/13/2021   TRIG 83 10/13/2021   HDL 47 10/13/2021   LDLCALC 89 10/13/2021   ALT 6 10/13/2021   AST 10 10/13/2021   NA 140 10/13/2021   K 4.5 10/13/2021   CL 106 10/13/2021   CREATININE 0.89 10/13/2021   BUN 8 10/13/2021   CO2 22 10/13/2021   TSH 2.550 10/13/2021   HGBA1C 5.5 10/13/2021   AUDIOMETRY: LEFT EAR: no frequency detected.  RIGHT EAR: normal.   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 Refer to ENT. Audiometry completed in our office and was abnormal.      Relevant Orders   Ambulatory referral to ENT   MR BRAIN IAC W CONTRAST   PR TYMPANOMETRY     Musculoskeletal and Integument   Nail deformity    Check labs.      Relevant Orders   Iron, TIBC and Ferritin Panel     Other   Vitamin D deficiency    Check vitamin D level.      Relevant Orders   VITAMIN D 25 Hydroxy (Vit-D Deficiency, Fractures)   B12 deficiency - Primary    Check B12 level.      Relevant Orders   B12 and Folate Panel   Methylmalonic acid, serum   Other fatigue    Check labs.      Relevant Orders   CBC with Differential/Platelet   Comprehensive metabolic panel  .  No orders of the defined types were placed in  this  encounter.   Orders Placed This Encounter  Procedures   MR BRAIN IAC W CONTRAST   CBC with Differential/Platelet   Comprehensive metabolic panel   D92 and Folate Panel   Methylmalonic acid, serum   VITAMIN D 25 Hydroxy (Vit-D Deficiency, Fractures)   Iron, TIBC and Ferritin Panel   Ambulatory referral to ENT   PR TYMPANOMETRY     Follow-up: No follow-ups on file.  An After Visit Summary was printed and given to the patient.  Rochel Brome, MD [facility] [phone]

## 2022-03-06 LAB — COMPREHENSIVE METABOLIC PANEL
ALT: 10 IU/L (ref 0–32)
AST: 12 IU/L (ref 0–40)
Albumin/Globulin Ratio: 1.4 (ref 1.2–2.2)
Albumin: 4.2 g/dL (ref 3.8–4.8)
Alkaline Phosphatase: 79 IU/L (ref 44–121)
BUN/Creatinine Ratio: 12 (ref 9–23)
BUN: 10 mg/dL (ref 6–24)
Bilirubin Total: 0.4 mg/dL (ref 0.0–1.2)
CO2: 22 mmol/L (ref 20–29)
Calcium: 9 mg/dL (ref 8.7–10.2)
Chloride: 103 mmol/L (ref 96–106)
Creatinine, Ser: 0.84 mg/dL (ref 0.57–1.00)
Globulin, Total: 3 g/dL (ref 1.5–4.5)
Glucose: 84 mg/dL (ref 70–99)
Potassium: 4.2 mmol/L (ref 3.5–5.2)
Sodium: 140 mmol/L (ref 134–144)
Total Protein: 7.2 g/dL (ref 6.0–8.5)
eGFR: 87 mL/min/{1.73_m2} (ref >59)

## 2022-03-06 LAB — IRON,TIBC AND FERRITIN PANEL
Ferritin: 110 ng/mL (ref 15–150)
Iron Saturation: 22 % (ref 15–55)
Iron: 75 ug/dL (ref 27–159)
Total Iron Binding Capacity: 334 ug/dL (ref 250–450)
UIBC: 259 ug/dL (ref 131–425)

## 2022-03-06 LAB — CBC WITH DIFFERENTIAL/PLATELET
Basophils Absolute: 0.1 10*3/uL (ref 0.0–0.2)
Basos: 1 %
EOS (ABSOLUTE): 0.3 10*3/uL (ref 0.0–0.4)
Eos: 3 %
Hematocrit: 39.7 % (ref 34.0–46.6)
Hemoglobin: 12.9 g/dL (ref 11.1–15.9)
Immature Grans (Abs): 0 10*3/uL (ref 0.0–0.1)
Immature Granulocytes: 0 %
Lymphocytes Absolute: 4.3 10*3/uL — ABNORMAL HIGH (ref 0.7–3.1)
Lymphs: 46 %
MCH: 27.9 pg (ref 26.6–33.0)
MCHC: 32.5 g/dL (ref 31.5–35.7)
MCV: 86 fL (ref 79–97)
Monocytes Absolute: 0.5 10*3/uL (ref 0.1–0.9)
Monocytes: 5 %
Neutrophils Absolute: 4.1 10*3/uL (ref 1.4–7.0)
Neutrophils: 45 %
Platelets: 312 10*3/uL (ref 150–450)
RBC: 4.62 x10E6/uL (ref 3.77–5.28)
RDW: 12 % (ref 11.7–15.4)
WBC: 9.3 10*3/uL (ref 3.4–10.8)

## 2022-03-06 LAB — B12 AND FOLATE PANEL
Folate: 11.3 ng/mL (ref >3.0)
Vitamin B-12: 244 pg/mL (ref 232–1245)

## 2022-03-06 LAB — VITAMIN D 25 HYDROXY (VIT D DEFICIENCY, FRACTURES): Vit D, 25-Hydroxy: 41.2 ng/mL (ref 30.0–100.0)

## 2022-03-06 LAB — METHYLMALONIC ACID, SERUM: Methylmalonic Acid: 115 nmol/L (ref 0–378)

## 2022-03-10 ENCOUNTER — Other Ambulatory Visit: Payer: Self-pay

## 2022-03-10 DIAGNOSIS — H9192 Unspecified hearing loss, left ear: Secondary | ICD-10-CM

## 2022-03-10 NOTE — Progress Notes (Signed)
Vitamin d normal.

## 2022-03-11 ENCOUNTER — Other Ambulatory Visit: Payer: Self-pay | Admitting: Family Medicine

## 2022-03-11 DIAGNOSIS — H9192 Unspecified hearing loss, left ear: Secondary | ICD-10-CM

## 2022-03-13 ENCOUNTER — Other Ambulatory Visit (HOSPITAL_BASED_OUTPATIENT_CLINIC_OR_DEPARTMENT_OTHER): Payer: PRIVATE HEALTH INSURANCE

## 2022-03-13 ENCOUNTER — Ambulatory Visit (HOSPITAL_BASED_OUTPATIENT_CLINIC_OR_DEPARTMENT_OTHER)
Admission: RE | Admit: 2022-03-13 | Discharge: 2022-03-13 | Disposition: A | Discharge status: Home / Self Care | Payer: No Typology Code available for payment source | Source: Ambulatory Visit | Attending: Family Medicine | Admitting: Family Medicine

## 2022-03-13 DIAGNOSIS — H9192 Unspecified hearing loss, left ear: Secondary | ICD-10-CM | POA: Diagnosis not present

## 2022-03-13 MED ORDER — GADOBUTROL 1 MMOL/ML IV SOLN
8.2000 mL | Freq: Once | INTRAVENOUS | Status: AC | PRN
  Administered 2022-03-13: 8.2 mL via INTRAVENOUS

## 2022-03-15 ENCOUNTER — Other Ambulatory Visit: Payer: Self-pay | Admitting: Family Medicine

## 2022-03-15 MED ORDER — DOXYCYCLINE HYCLATE 100 MG PO TABS: 100.0000 mg | ORAL_TABLET | Freq: Two times a day (BID) | ORAL | 0 refills | Status: DC

## 2022-03-18 ENCOUNTER — Other Ambulatory Visit: Payer: Self-pay | Admitting: Legal Medicine

## 2022-03-18 DIAGNOSIS — E559 Vitamin D deficiency, unspecified: Secondary | ICD-10-CM

## 2022-03-23 ENCOUNTER — Other Ambulatory Visit: Payer: Self-pay | Admitting: Family Medicine

## 2022-03-23 MED ORDER — DOXYCYCLINE HYCLATE 100 MG PO TABS: 100.0000 mg | ORAL_TABLET | Freq: Two times a day (BID) | ORAL | 0 refills | Status: DC

## 2022-04-19 ENCOUNTER — Encounter: Payer: Self-pay | Admitting: Family Medicine

## 2022-04-19 ENCOUNTER — Other Ambulatory Visit: Payer: Self-pay | Admitting: Family Medicine

## 2022-04-19 ENCOUNTER — Ambulatory Visit (INDEPENDENT_AMBULATORY_CARE_PROVIDER_SITE_OTHER): Payer: No Typology Code available for payment source | Admitting: Family Medicine

## 2022-04-19 VITALS — BP 108/70 | HR 99 | Temp 97.4°F | Ht 63.0 in | Wt 182.4 lb

## 2022-04-19 DIAGNOSIS — N3001 Acute cystitis with hematuria: Secondary | ICD-10-CM | POA: Diagnosis not present

## 2022-04-19 LAB — POCT URINALYSIS DIP (CLINITEK)
Bilirubin, UA: NEGATIVE
Glucose, UA: NEGATIVE mg/dL
Ketones, POC UA: NEGATIVE mg/dL
Nitrite, UA: POSITIVE — AB
Spec Grav, UA: 1.02 (ref 1.010–1.025)
Urobilinogen, UA: 0.2 E.U./dL
pH, UA: 5 (ref 5.0–8.0)

## 2022-04-19 MED ORDER — CIPROFLOXACIN HCL 250 MG PO TABS: 250.0000 mg | ORAL_TABLET | Freq: Two times a day (BID) | ORAL | 0 refills | Status: AC

## 2022-04-19 NOTE — Progress Notes (Signed)
Acute Office Visit  Subjective:    Patient ID: [name], female    DOB: April 10, 1976, 46 y.o.   MRN: [id number]  Chief Complaint  Patient presents with   Urinary Tract Infection    HPI: Patient is in today for UTI symptoms. Complaining of end stream dysuria. Suprapubic abdominal pain. Urine positive.   Past Medical History:  Diagnosis Date   AMA (advanced maternal age) multigravida 74+    Medical history non-contributory     Past Surgical History:  Procedure Laterality Date   CESAREAN SECTION  2016   NO PAST SURGERIES      Family History  Problem Relation Age of Onset   Diabetes Mother    High blood pressure Mother    Pulmonary fibrosis Mother    Diabetes Father    High blood pressure Father    High blood pressure Sister    Diabetes Paternal Grandmother    Diabetes Paternal Grandfather    Breast cancer Neg Hx     Social History   Socioeconomic History   Marital status: Single    Spouse name: Not on file   Number of children: 1   Years of education: Not on file   Highest education level: Bachelor's degree (e.g., BA, AB, BS)  Occupational History   Occupation: Psychologist, sport and exercise  Tobacco Use   Smoking status: Never   Smokeless tobacco: Never  Vaping Use   Vaping Use: Never used  Substance and Sexual Activity   Alcohol use: Yes    Alcohol/week: 2.0 standard drinks of alcohol    Types: 2 Cans of beer per week    Comment: BEER - OCCAS.WEEKENDS   Drug use: Never   Sexual activity: Yes    Birth control/protection: Patch    Comment: 1ST INTERCOURSE- 70, PARTNERS- 7  Other Topics Concern   Not on file  Social History Narrative   Not on file   Social Determinants of Health   Financial Resource Strain: Not on file  Food Insecurity: Not on file  Transportation Needs: Not on file  Physical Activity: Not on file  Stress: Not on file  Social Connections: Not on file  Intimate Partner Violence: Not on file    Outpatient Medications Prior to  Visit  Medication Sig Dispense Refill   ibuprofen (ADVIL) 600 MG tablet Take 1 tablet (600 mg total) by mouth every 8 (eight) hours as needed. 90 tablet 1   norelgestromin-ethinyl estradiol Marilu Favre) 150-35 MCG/24HR transdermal patch Place 1 patch onto the skin once a week. 9 patch 4   Vitamin D, Ergocalciferol, (DRISDOL) 1.25 MG (50000 UNIT) CAPS capsule TAKE 1 CAPSULE (50,000 UNITS TOTAL) BY MOUTH EVERY 7 (SEVEN) DAYS 12 capsule 3   doxycycline (VIBRA-TABS) 100 MG tablet Take 1 tablet (100 mg total) by mouth 2 (two) times daily. 20 tablet 0   No facility-administered medications prior to visit.    No Known Allergies  Review of Systems  Constitutional:  Negative for chills and fever.  Gastrointestinal:  Positive for abdominal pain. Negative for nausea and vomiting.  Genitourinary:  Positive for dysuria.       Objective:    Physical Exam Vitals reviewed.  Constitutional:      Appearance: Normal appearance. She is normal weight.  Cardiovascular:     Rate and Rhythm: Normal rate and regular rhythm.     Heart sounds: Normal heart sounds.  Pulmonary:     Effort: Pulmonary effort is normal. No respiratory distress.     Breath  sounds: Normal breath sounds.  Abdominal:     General: Abdomen is flat. Bowel sounds are normal.     Palpations: Abdomen is soft.     Tenderness: There is abdominal tenderness (suprapubic.).  Neurological:     Mental Status: She is alert and oriented to person, place, and time.  Psychiatric:        Mood and Affect: Mood normal.        Behavior: Behavior normal.     BP 108/70 (BP Location: Left Arm, Patient Position: Sitting, Cuff Size: Large)   Pulse 99   Temp (!) 97.4 F (36.3 C) (Temporal)   Ht _0  (1.6 m)   Wt 182 lb 6.4 oz (82.7 kg)   SpO2 98%   BMI 32.31 kg/m  Wt Readings from Last 3 Encounters:  04/19/22 182 lb 6.4 oz (82.7 kg)  03/04/22 180 lb (81.6 kg)  02/19/22 180 lb (81.6 kg)    Health Maintenance Due  Topic Date Due   Hepatitis  C Screening  Never done   COLONOSCOPY (Pts 45-79yr Insurance coverage will need to be confirmed)  Never done   INFLUENZA VACCINE  04/06/2022    There are no preventive care reminders to display for this patient.   Lab Results  Component Value Date   TSH 2.550 10/13/2021   Lab Results  Component Value Date   WBC 9.3 03/04/2022   HGB 12.9 03/04/2022   HCT 39.7 03/04/2022   MCV 86 03/04/2022   PLT 312 03/04/2022   Lab Results  Component Value Date   NA 140 03/04/2022   K 4.2 03/04/2022   CO2 22 03/04/2022   GLUCOSE 84 03/04/2022   BUN 10 03/04/2022   CREATININE 0.84 03/04/2022   BILITOT 0.4 03/04/2022   ALKPHOS 79 03/04/2022   AST 12 03/04/2022   ALT 10 03/04/2022   PROT 7.2 03/04/2022   ALBUMIN 4.2 03/04/2022   CALCIUM 9.0 03/04/2022   EGFR 87 03/04/2022   Lab Results  Component Value Date   CHOL 152 10/13/2021   Lab Results  Component Value Date   HDL 47 10/13/2021   Lab Results  Component Value Date   LDLCALC 89 10/13/2021   Lab Results  Component Value Date   TRIG 83 10/13/2021   Lab Results  Component Value Date   CHOLHDL 3.2 10/13/2021   Lab Results  Component Value Date   HGBA1C 5.5 10/13/2021       Assessment & Plan:   Problem List Items Addressed This Visit       Genitourinary   Acute cystitis with hematuria - Primary    Start cipro 250 mg one twice daily x 3 days.      Relevant Orders   POCT URINALYSIS DIP (CLINITEK) (Completed)   Urine Culture   No orders of the defined types were placed in this encounter.   Orders Placed This Encounter  Procedures   Urine Culture   POCT URINALYSIS DIP (CLINITEK)     Follow-up: No follow-ups on file.  An After Visit Summary was printed and given to the patient.  KRochel Brome MD [facility] [phone]

## 2022-04-19 NOTE — Assessment & Plan Note (Signed)
Start cipro 250 mg one twice daily x 3 days.

## 2022-04-21 ENCOUNTER — Other Ambulatory Visit: Payer: Self-pay | Admitting: Family Medicine

## 2022-04-21 LAB — URINE CULTURE

## 2022-04-21 MED ORDER — AMOXICILLIN-POT CLAVULANATE 875-125 MG PO TABS: 1.0000 | ORAL_TABLET | Freq: Two times a day (BID) | ORAL | 0 refills | Status: DC

## 2022-05-26 ENCOUNTER — Other Ambulatory Visit: Payer: Self-pay | Admitting: Legal Medicine

## 2022-05-26 DIAGNOSIS — J028 Acute pharyngitis due to other specified organisms: Secondary | ICD-10-CM

## 2022-05-26 MED ORDER — AZITHROMYCIN 250 MG PO TABS: ORAL_TABLET | ORAL | 0 refills | Status: AC

## 2022-06-15 ENCOUNTER — Ambulatory Visit (INDEPENDENT_AMBULATORY_CARE_PROVIDER_SITE_OTHER): Payer: No Typology Code available for payment source

## 2022-06-15 DIAGNOSIS — Z23 Encounter for immunization: Secondary | ICD-10-CM | POA: Diagnosis not present

## 2022-07-08 ENCOUNTER — Ambulatory Visit (INDEPENDENT_AMBULATORY_CARE_PROVIDER_SITE_OTHER): Payer: No Typology Code available for payment source | Admitting: Nurse Practitioner

## 2022-07-08 ENCOUNTER — Encounter: Payer: Self-pay | Admitting: Nurse Practitioner

## 2022-07-08 VITALS — BP 120/82 | HR 114 | Temp 97.4°F | Ht 63.0 in | Wt 179.0 lb

## 2022-07-08 DIAGNOSIS — Z111 Encounter for screening for respiratory tuberculosis: Secondary | ICD-10-CM | POA: Diagnosis not present

## 2022-07-08 DIAGNOSIS — H9192 Unspecified hearing loss, left ear: Secondary | ICD-10-CM

## 2022-07-08 DIAGNOSIS — J018 Other acute sinusitis: Secondary | ICD-10-CM | POA: Diagnosis not present

## 2022-07-08 MED ORDER — AZITHROMYCIN 250 MG PO TABS: ORAL_TABLET | ORAL | 0 refills | Status: AC

## 2022-07-08 MED ORDER — TRIAMCINOLONE ACETONIDE 40 MG/ML IJ SUSP
60.0000 mg | Freq: Once | INTRAMUSCULAR | Status: AC
  Administered 2022-07-08: 60 mg via INTRAMUSCULAR

## 2022-07-08 MED ORDER — PROMETHAZINE-DM 6.25-15 MG/5ML PO SYRP: 5.0000 mL | ORAL_SOLUTION | Freq: Four times a day (QID) | ORAL | 0 refills | Status: DC | PRN

## 2022-07-08 NOTE — Patient Instructions (Signed)
Rest and push fluids Take Z-pack as directed Use Flonase nasal spray daily Take Promethazine-DM up to 4 times daily as needed for cough Follow-up as needed    Sinus Infection, Adult A sinus infection is soreness and swelling (inflammation) of your sinuses. Sinuses are hollow spaces in the bones around your face. They are located: Around your eyes. In the middle of your forehead. Behind your nose. In your cheekbones. Your sinuses and nasal passages are lined with a fluid called mucus. Mucus drains out of your sinuses. Swelling can trap mucus in your sinuses. This lets germs (bacteria, virus, or fungus) grow, which leads to infection. Most of the time, this condition is caused by a virus. What are the causes? Allergies. Asthma. Germs. Things that block your nose or sinuses. Growths in the nose (nasal polyps). Chemicals or irritants in the air. A fungus. This is rare. What increases the risk? Having a weak body defense system (immune system). Doing a lot of swimming or diving. Using nasal sprays too much. Smoking. What are the signs or symptoms? The main symptoms of this condition are pain and a feeling of pressure around the sinuses. Other symptoms include: Stuffy nose (congestion). This may make it hard to breathe through your nose. Runny nose (drainage). Soreness, swelling, and warmth in the sinuses. A cough that may get worse at night. Being unable to smell and taste. Mucus that collects in the throat or the back of the nose (postnasal drip). This may cause a sore throat or bad breath. Being very tired (fatigued). A fever. How is this diagnosed? Your symptoms. Your medical history. A physical exam. Tests to find out if your condition is short-term (acute) or long-term (chronic). Your doctor may: Check your nose for growths (polyps). Check your sinuses using a tool that has a light on one end (endoscope). Check for allergies or germs. Do imaging tests, such as an MRI or  CT scan. How is this treated? Treatment for this condition depends on the cause and whether it is short-term or long-term. If caused by a virus, your symptoms should go away on their own within 10 days. You may be given medicines to relieve symptoms. They include: Medicines that shrink swollen tissue in the nose. A spray that treats swelling of the nostrils. Rinses that help get rid of thick mucus in your nose (nasal saline washes). Medicines that treat allergies (antihistamines). Over-the-counter pain relievers. If caused by bacteria, your doctor may wait to see if you will get better without treatment. You may be given antibiotic medicine if you have: A very bad infection. A weak body defense system. If caused by growths in the nose, surgery may be needed. Follow these instructions at home: Medicines Take, use, or apply over-the-counter and prescription medicines only as told by your doctor. These may include nasal sprays. If you were prescribed an antibiotic medicine, take it as told by your doctor. Do not stop taking it even if you start to feel better. Hydrate and humidify  Drink enough water to keep your pee (urine) pale yellow. Use a cool mist humidifier to keep the humidity level in your home above 50%. Breathe in steam for 10-15 minutes, 3-4 times a day, or as told by your doctor. You can do this in the bathroom while a hot shower is running. Try not to spend time in cool or dry air. Rest Rest as much as you can. Sleep with your head raised (elevated). Make sure you get enough sleep each night.  General instructions  Put a warm, moist washcloth on your face 3-4 times a day, or as often as told by your doctor. Use nasal saline washes as often as told by your doctor. Wash your hands often with soap and water. If you cannot use soap and water, use hand sanitizer. Do not smoke. Avoid being around people who are smoking (secondhand smoke). Keep all follow-up visits. Contact a  doctor if: You have a fever. Your symptoms get worse. Your symptoms do not get better within 10 days. Get help right away if: You have a very bad headache. You cannot stop vomiting. You have very bad pain or swelling around your face or eyes. You have trouble seeing. You feel confused. Your neck is stiff. You have trouble breathing. These symptoms may be an emergency. Get help right away. Call 911. Do not wait to see if the symptoms will go away. Do not drive yourself to the hospital. Summary A sinus infection is swelling of your sinuses. Sinuses are hollow spaces in the bones around your face. This condition is caused by tissues in your nose that become inflamed or swollen. This traps germs. These can lead to infection. If you were prescribed an antibiotic medicine, take it as told by your doctor. Do not stop taking it even if you start to feel better. Keep all follow-up visits. This information is not intended to replace advice given to you by your health care provider. Make sure you discuss any questions you have with your health care provider. Document Revised: 07/28/2021 Document Reviewed: 07/28/2021 Elsevier Patient Education  [place].

## 2022-07-08 NOTE — Progress Notes (Signed)
Acute Office Visit  Subjective:    Patient ID: [name], female    DOB: 01/10/1976, 46 y.o.   MRN: [id number]  Chief Complaint  Patient presents with   Cough    HPI: Patient is in today for non-productive cough and sinus congestion for 3 days. Denies fever, rash, or body aches.Treatment has included Sudafed, Tylenol Cold and Flu, and pushing fluids.She has been in contact with sick co-workers recently.   Past Medical History:  Diagnosis Date   AMA (advanced maternal age) multigravida 42+    Medical history non-contributory     Past Surgical History:  Procedure Laterality Date   CESAREAN SECTION  2016   NO PAST SURGERIES      Family History  Problem Relation Age of Onset   Diabetes Mother    High blood pressure Mother    Pulmonary fibrosis Mother    Diabetes Father    High blood pressure Father    High blood pressure Sister    Diabetes Paternal Grandmother    Diabetes Paternal Grandfather    Breast cancer Neg Hx     Social History   Socioeconomic History   Marital status: Single    Spouse name: Not on file   Number of children: 1   Years of education: Not on file   Highest education level: Bachelor's degree (e.g., BA, AB, BS)  Occupational History   Occupation: Psychologist, sport and exercise  Tobacco Use   Smoking status: Never   Smokeless tobacco: Never  Vaping Use   Vaping Use: Never used  Substance and Sexual Activity   Alcohol use: Yes    Alcohol/week: 2.0 standard drinks of alcohol    Types: 2 Cans of beer per week    Comment: BEER - OCCAS.WEEKENDS   Drug use: Never   Sexual activity: Yes    Birth control/protection: Patch    Comment: 1ST INTERCOURSE- 55, PARTNERS- 7  Other Topics Concern   Not on file  Social History Narrative   Not on file   Social Determinants of Health   Financial Resource Strain: Not on file  Food Insecurity: Not on file  Transportation Needs: Not on file  Physical Activity: Not on file  Stress: Not on file  Social  Connections: Not on file  Intimate Partner Violence: Not on file    Outpatient Medications Prior to Visit  Medication Sig Dispense Refill   ibuprofen (ADVIL) 600 MG tablet Take 1 tablet (600 mg total) by mouth every 8 (eight) hours as needed. 90 tablet 1   norelgestromin-ethinyl estradiol Marilu Favre) 150-35 MCG/24HR transdermal patch Place 1 patch onto the skin once a week. 9 patch 4   Vitamin D, Ergocalciferol, (DRISDOL) 1.25 MG (50000 UNIT) CAPS capsule TAKE 1 CAPSULE (50,000 UNITS TOTAL) BY MOUTH EVERY 7 (SEVEN) DAYS 12 capsule 3   amoxicillin-clavulanate (AUGMENTIN) 875-125 MG tablet Take 1 tablet by mouth 2 (two) times daily. 14 tablet 0   No facility-administered medications prior to visit.    No Known Allergies  Review of Systems  Constitutional:  Positive for chills. Negative for fatigue.  HENT:  Positive for congestion, hearing loss and sore throat. Negative for ear pain, nosebleeds and sinus pain.   Respiratory:  Positive for cough and shortness of breath.   Cardiovascular:  Negative for chest pain and leg swelling.  Gastrointestinal:  Positive for diarrhea. Negative for abdominal pain, constipation, nausea and vomiting.  Genitourinary:  Negative for dysuria and frequency.  Musculoskeletal:  Negative for arthralgias, back pain and  myalgias.  Neurological:  Negative for dizziness and headaches.       Objective:    Physical Exam Vitals reviewed.  Constitutional:      Appearance: She is ill-appearing.  HENT:     Right Ear: A middle ear effusion is present. Tympanic membrane is erythematous.     Left Ear: Tenderness present. Tympanic membrane is erythematous.     Nose: Congestion and rhinorrhea present.     Mouth/Throat:     Pharynx: Posterior oropharyngeal erythema present.  Cardiovascular:     Rate and Rhythm: Regular rhythm. Tachycardia present.     Pulses: Normal pulses.     Heart sounds: Normal heart sounds.  Pulmonary:     Effort: Pulmonary effort is normal.      Breath sounds: Normal breath sounds.  Abdominal:     General: Bowel sounds are normal.     Palpations: Abdomen is soft.  Lymphadenopathy:     Cervical: No cervical adenopathy.  Skin:    Capillary Refill: Capillary refill takes less than 2 seconds.  Neurological:     Mental Status: She is alert.     BP 120/82   Pulse (!) 114   Temp (!) 97.4 F (36.3 C)   Ht _0  (1.6 m)   Wt 179 lb (81.2 kg)   SpO2 99%   BMI 31.71 kg/m  Wt Readings from Last 3 Encounters:  07/08/22 179 lb (81.2 kg)  04/19/22 182 lb 6.4 oz (82.7 kg)  03/04/22 180 lb (81.6 kg)    Health Maintenance Due  Topic Date Due   Hepatitis C Screening  Never done   COLONOSCOPY (Pts 45-40yr Insurance coverage will need to be confirmed)  Never done   PAP SMEAR-Modifier  09/03/2022    Lab Results  Component Value Date   TSH 2.550 10/13/2021   Lab Results  Component Value Date   WBC 9.3 03/04/2022   HGB 12.9 03/04/2022   HCT 39.7 03/04/2022   MCV 86 03/04/2022   PLT 312 03/04/2022   Lab Results  Component Value Date   NA 140 03/04/2022   K 4.2 03/04/2022   CO2 22 03/04/2022   GLUCOSE 84 03/04/2022   BUN 10 03/04/2022   CREATININE 0.84 03/04/2022   BILITOT 0.4 03/04/2022   ALKPHOS 79 03/04/2022   AST 12 03/04/2022   ALT 10 03/04/2022   PROT 7.2 03/04/2022   ALBUMIN 4.2 03/04/2022   CALCIUM 9.0 03/04/2022   EGFR 87 03/04/2022   Lab Results  Component Value Date   CHOL 152 10/13/2021   Lab Results  Component Value Date   HDL 47 10/13/2021   Lab Results  Component Value Date   LDLCALC 89 10/13/2021   Lab Results  Component Value Date   TRIG 83 10/13/2021   Lab Results  Component Value Date   CHOLHDL 3.2 10/13/2021   Lab Results  Component Value Date   HGBA1C 5.5 10/13/2021       Assessment & Plan:    1. Acute non-recurrent sinusitis of other sinus - azithromycin (ZITHROMAX) 250 MG tablet; Take 2 tablets on day 1, then 1 tablet daily on days 2 through 5  Dispense: 6 tablet;  Refill: 0 - promethazine-dextromethorphan (PROMETHAZINE-DM) 6.25-15 MG/5ML syrup; Take 5 mLs by mouth 4 (four) times daily as needed.  Dispense: 118 mL; Refill: 0 - triamcinolone acetonide (KENALOG-40) injection 60 mg  2. Screening-pulmonary TB - QuantiFERON-TB Gold Plus  3. Hearing loss associated with syndrome of left ear - Ambulatory referral  to Audiology    Rest and push fluids Take Z-pack as directed Use Flonase nasal spray daily Take Promethazine-DM up to 4 times daily as needed for cough Follow-up as needed    Follow-up: PRN  An After Visit Summary was printed and given to the patient.  I, Rip Harbour, NP, have reviewed all documentation for this visit. The documentation on 07/08/22 for the exam, diagnosis, procedures, and orders are all accurate and complete.   Signed, Rip Harbour, NP [place] [phone]

## 2022-07-11 LAB — QUANTIFERON-TB GOLD PLUS
QuantiFERON Mitogen Value: >10 IU/mL
QuantiFERON Nil Value: 0.57 IU/mL
QuantiFERON TB1 Ag Value: 7.48 IU/mL
QuantiFERON TB2 Ag Value: 7.62 IU/mL
QuantiFERON-TB Gold Plus: POSITIVE — AB

## 2022-07-19 ENCOUNTER — Other Ambulatory Visit: Payer: Self-pay | Admitting: Nurse Practitioner

## 2022-07-19 DIAGNOSIS — Z8619 Personal history of other infectious and parasitic diseases: Secondary | ICD-10-CM

## 2022-07-19 MED ORDER — FLUCONAZOLE 150 MG PO TABS: 150.0000 mg | ORAL_TABLET | Freq: Once | ORAL | 0 refills | Status: AC

## 2022-07-27 ENCOUNTER — Other Ambulatory Visit: Payer: Self-pay | Admitting: Nurse Practitioner

## 2022-07-27 DIAGNOSIS — R197 Diarrhea, unspecified: Secondary | ICD-10-CM

## 2022-07-27 MED ORDER — DIPHENOXYLATE-ATROPINE 2.5-0.025 MG PO TABS: 1.0000 | ORAL_TABLET | Freq: Four times a day (QID) | ORAL | 0 refills | Status: DC | PRN

## 2022-08-03 ENCOUNTER — Other Ambulatory Visit: Payer: Self-pay | Admitting: Nurse Practitioner

## 2022-08-03 DIAGNOSIS — Z020 Encounter for examination for admission to educational institution: Secondary | ICD-10-CM

## 2022-08-04 ENCOUNTER — Ambulatory Visit (HOSPITAL_BASED_OUTPATIENT_CLINIC_OR_DEPARTMENT_OTHER)
Admission: RE | Admit: 2022-08-04 | Discharge: 2022-08-04 | Disposition: A | Discharge status: Home / Self Care | Payer: No Typology Code available for payment source | Source: Ambulatory Visit | Attending: Nurse Practitioner | Admitting: Nurse Practitioner

## 2022-08-04 DIAGNOSIS — Z020 Encounter for examination for admission to educational institution: Secondary | ICD-10-CM | POA: Insufficient documentation

## 2022-08-05 ENCOUNTER — Other Ambulatory Visit: Payer: Self-pay

## 2022-08-05 LAB — VARICELLA ZOSTER ANTIBODY, IGG: Varicella zoster IgG: 878 index (ref >165)

## 2022-08-05 LAB — HBSAB QUANT HBIG ASSESSMENT: HBsAb Quant HBIG Assessment: 845.6 m[IU]/mL

## 2022-08-05 LAB — MEASLES/MUMPS/RUBELLA IMMUNITY
MUMPS ABS, IGG: <9 AU/mL — ABNORMAL LOW (ref >10.9)
RUBEOLA AB, IGG: 159 AU/mL (ref >16.4)
Rubella Antibodies, IGG: >33 index (ref >0.99)

## 2022-08-16 ENCOUNTER — Ambulatory Visit (INDEPENDENT_AMBULATORY_CARE_PROVIDER_SITE_OTHER): Payer: No Typology Code available for payment source

## 2022-08-16 DIAGNOSIS — Z020 Encounter for examination for admission to educational institution: Secondary | ICD-10-CM

## 2022-08-16 DIAGNOSIS — Z23 Encounter for immunization: Secondary | ICD-10-CM | POA: Diagnosis not present

## 2022-08-16 NOTE — Patient Instructions (Signed)
Measles; Mumps; Rubella (MMR) Vaccine Injection Qu es este medicamento? La VACUNA CONTRA LOS VIRUS DE SARAMPIN; PAPERAS; RUBOLA se utiliza para prevenir una infeccin por los virus del [place], paperas y rubola (sarampin alemn). Se [place] para prevenir [employer] de 12 meses de edad, adultos que no hayan recibido la vacuna y que no estn [place], personas que viajen a pases con altos ndices de sarampin, paperas o rubola. Este medicamento puede ser utilizado para otros usos; si tiene alguna pregunta consulte con su proveedor de atencin mdica o con su farmacutico. MARCAS COMUNES: M-M-R II Qu le debo informar a mi profesional de la salud antes de tomar este medicamento? Necesita saber si usted presenta alguno de los [employer] o situaciones: trastorno Chemical engineer, incluyendo leucemia o linfoma problemas del sistema inmunolgico infeccin con fiebre baja cantidad de plaquetas en la sangre infusin de inmunoglobulina o transfusin de sangre reciente trastorno de convulsiones si toma medicamentos para la inmunodepresin una Risk analyst o inusual a las vacunas, a los huevos, a la gelatina, a otros medicamentos, alimentos, Scientist, water quality o conservantes si est embarazada o buscando quedar embarazada si est amamantando a un beb Cmo debo Insurance account manager medicamento? Esta vacuna se administra mediante inyeccin por va subcutnea. Lo administra un profesional de [employer]	[place]. Recibir una copia de informacin escrita sobre la vacuna antes de cada vacuna. Asegrese de leer este folleto cada vez cuidadosamente. Este folleto puede cambiar con frecuencia. Hable con su pediatra para informarse acerca del uso de este medicamento en nios. Aunque este medicamento ha sido recetado a nios tan menores como de 12 meses de edad para condiciones selectivas, las precauciones se aplican. Sobredosis: Pngase en contacto inmediatamente con un centro  toxicolgico o una sala de urgencia si usted cree que haya tomado demasiado medicamento. ATENCIN: [employer] es solo para usted. No comparta este medicamento con nadie. Qu sucede si me olvido de una dosis? Asista a todos las citas para las dosis siguientes (dosis de Public librarian) Human resources officer indicado. Es importante no olvidar ninguna dosis. Informe a su mdico o a su profesional de la salud si no puede asistir a Photographer. Qu puede interactuar con este medicamento? No tome esta medicina con ninguno de los siguientes medicamentos: adalimumab anakinra etanercept infliximab medicamentos que suprimen el sistema inmunolgico medicamentos para tratar Research officer, political party medicina tambin puede interactuar con los siguientes medicamentos: inmunoglobulinas vacunas de virus vivos Puede ser que esta lista no menciona todas las posibles interacciones. Informe a su profesional de [employer]	[place] de [employer] productos a base de hierbas, medicamentos de [place] o suplementos nutritivos que est tomando. Si usted fuma, consume bebidas alcohlicas o si utiliza drogas ilegales, indqueselo tambin a su profesional de [employer]	[place]. Algunas sustancias pueden interactuar con su medicamento. A qu debo estar atento al usar [employer]? Visite a su mdico para chequear su evolucin peridicamente. No se debe quedar embarazada durante 3 meses despus de recibir esta vacuna. Las mujeres deben informar a su mdico si estn buscando quedar embarazadas o si creen que estn embarazadas. Existe la posibilidad de efectos secundarios graves a un beb sin nacer. Para ms informacin hable con su profesional de la salud o su farmacutico. Qu efectos secundarios puedo tener al utilizar este medicamento? Efectos secundarios que debe informar a su mdico o a Barrister's clerk de la salud tan pronto como sea posible: Chief of Staff como erupcin cutnea, picazn o urticarias, hinchazn de la cara, labios o  lengua problemas respiratorios cambios  de audicin cambios en la visin problemas al caminar cambios extremos de comportamiento pulso cardiaco rpido, irregular fiebre ms de 100 grados F dolor, hormigueo, entumecimiento [employer] o pies convulsiones sangrado, magulladuras inusuales cansancio o debilidad inusual Efectos secundarios que, por lo general, no requieren atencin mdica (debe informarlos a su mdico o a su profesional de la salud si persisten o si son molestos): molestias o dolores magulladuras, Social research officer, government, Estate agent de la inyeccin diarrea dolor de cabeza fiebre baja inferior a 100 grados F nuseas, vmito goteo de la [place], tos sooliento glndulas inflamadas Puede ser que esta lista no menciona todos los posibles efectos secundarios. Comunquese a su mdico por asesoramiento mdico [employer] Usted puede informar los efectos secundarios a la FDA por telfono al 1-800-FDA-1088. Dnde debo guardar mi medicina? Este medicamento se administra en hospitales o clnicas y no necesitar guardarlo en su domicilio. ATENCIN: Este folleto es un resumen. Puede ser que no cubra toda la posible informacin. Si usted tiene preguntas acerca de esta medicina, consulte con su mdico, su farmacutico o su profesional de Technical sales engineer.  2023 Elsevier/Gold Standard (2015-04-15 00:00:00)

## 2022-08-16 NOTE — Progress Notes (Addendum)
Marland Kitchen  MMR Vaccine given per order due to school requirements, patient tolerated well.   Erie Noe, LPN 9:45 PM

## 2022-08-21 IMAGING — MG MM DIGITAL SCREENING BILAT W/ TOMO AND CAD
8 series · 9 of 24 positions shown · non-contrast
Comparison: Previous exam(s).

CLINICAL DATA: Screening.

EXAM:
DIGITAL SCREENING BILATERAL MAMMOGRAM WITH TOMOSYNTHESIS AND CAD
TECHNIQUE: Bilateral screening digital craniocaudal and mediolateral oblique
mammograms were obtained. Bilateral screening digital breast
tomosynthesis was performed. The images were evaluated with
computer-aided detection.

[L CC synth-2D]
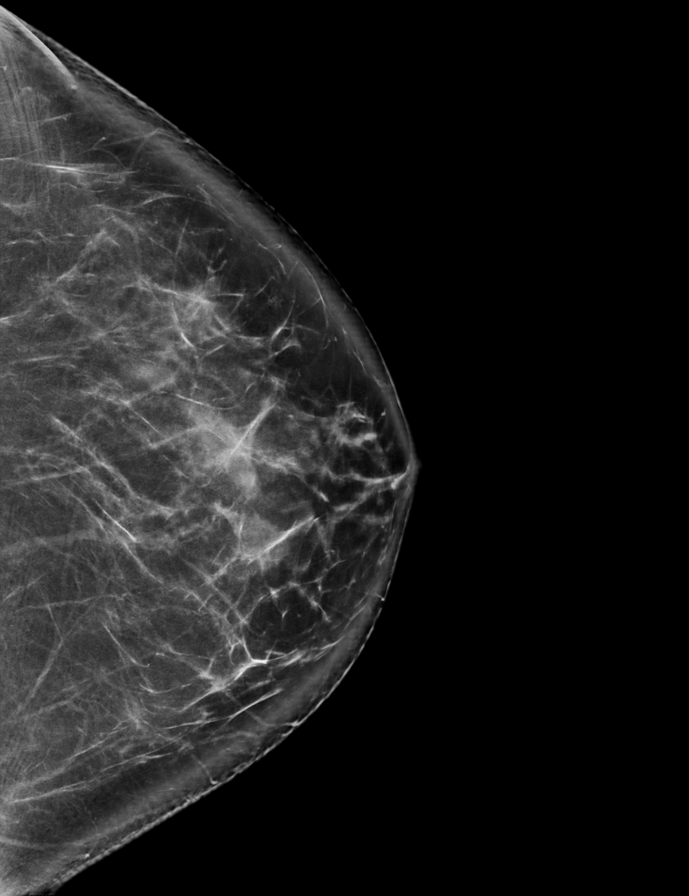

[L MLO synth-2D]
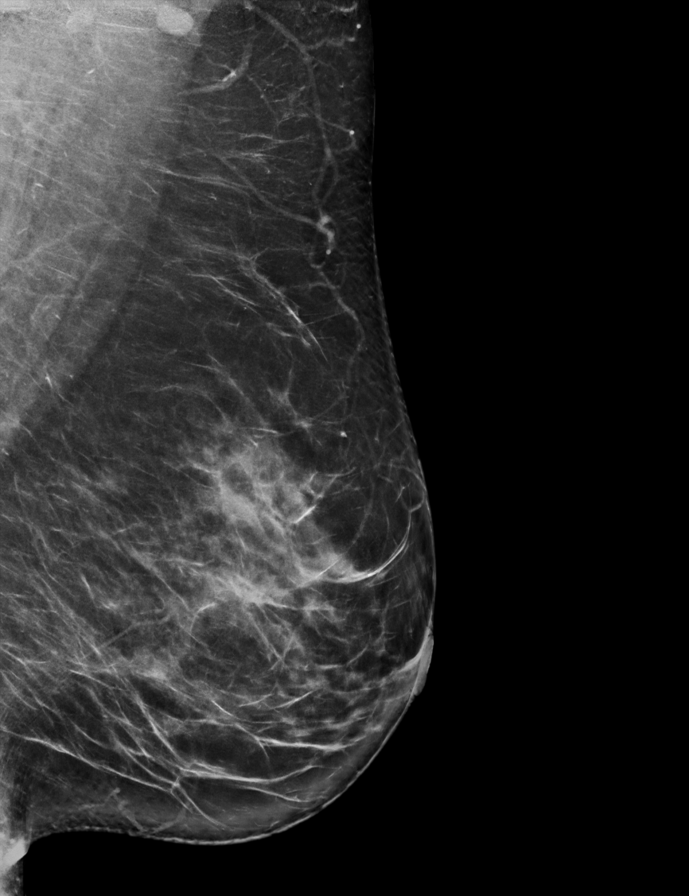

[R CC synth-2D]
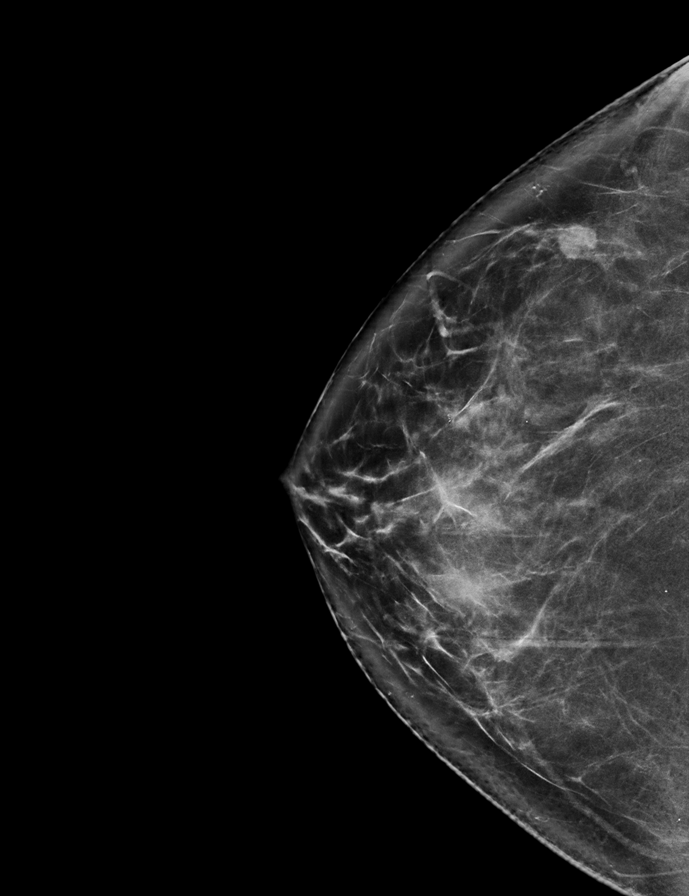

[R MLO synth-2D]
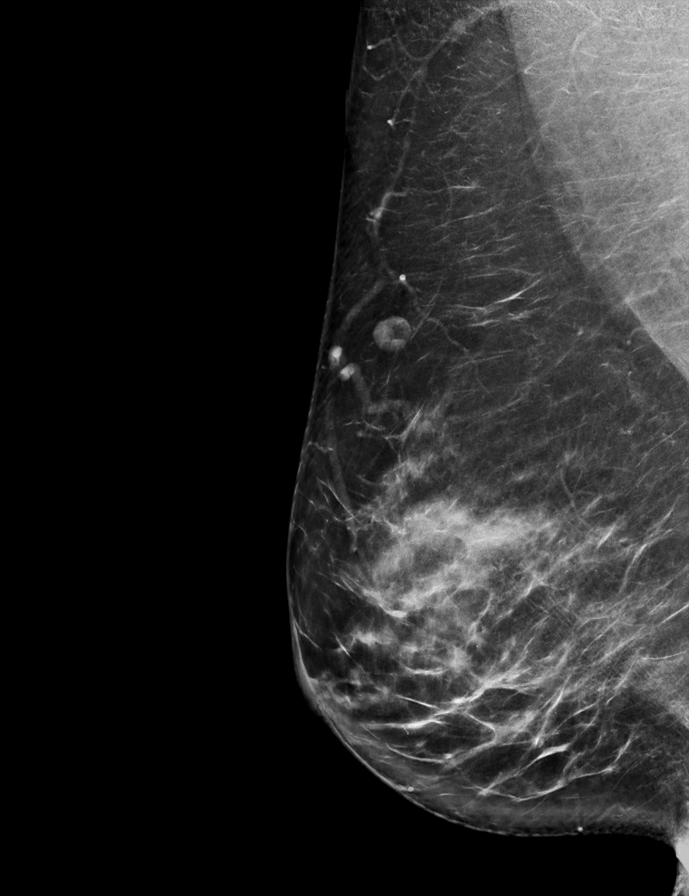

[R CC tomo · 2 of 80 frames shown]
[frame 26/80]
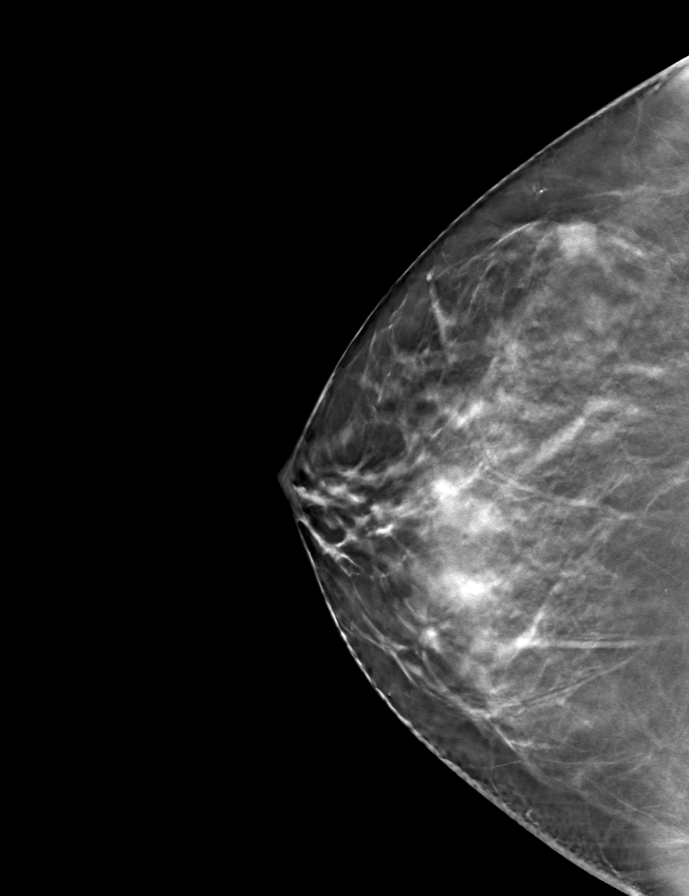
[frame 41/80]
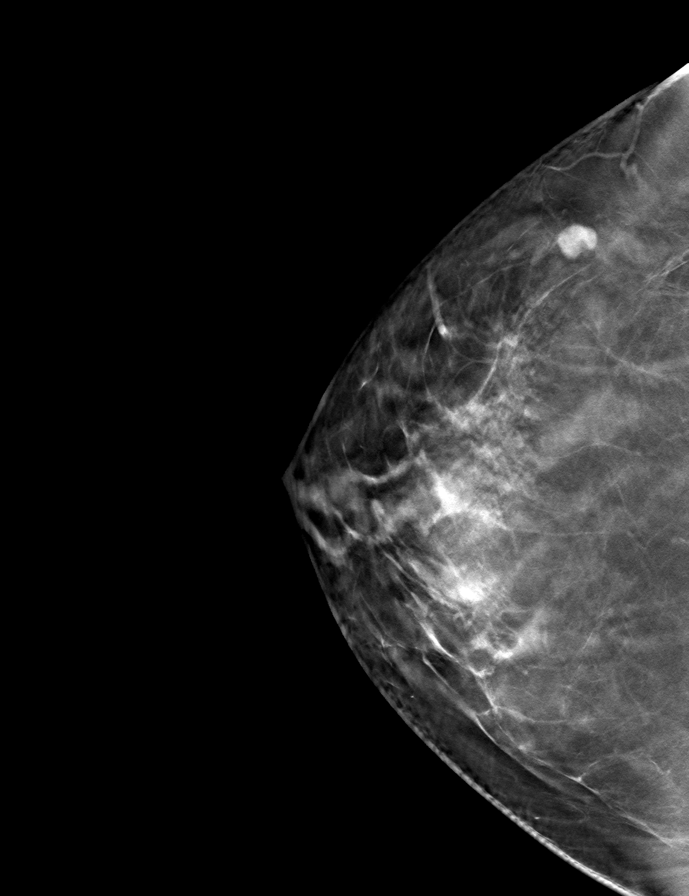

[L MLO tomo · tomo slice 44/87.0]
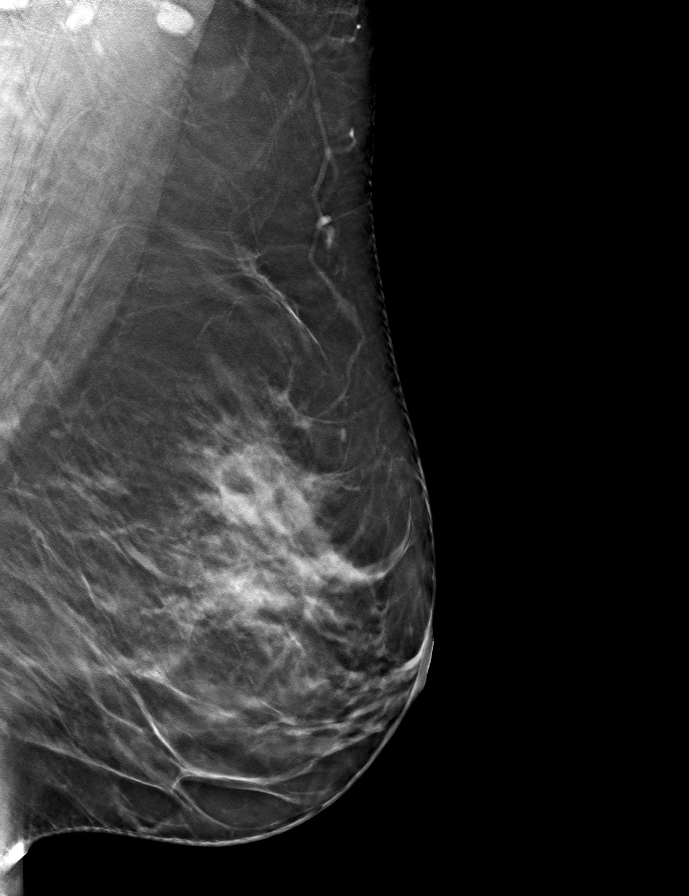

[L CC tomo · tomo slice 43/84.0]
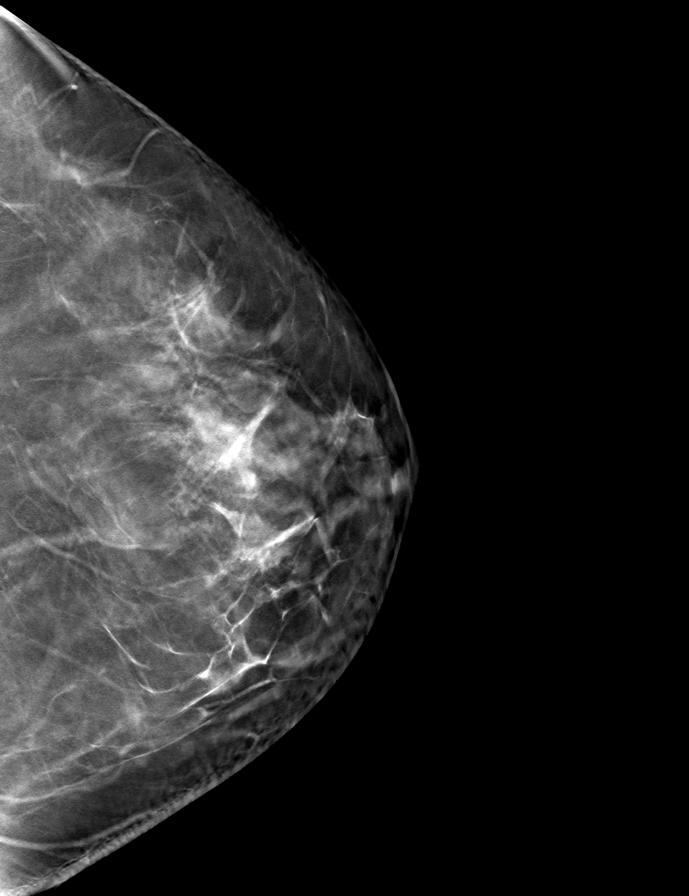

[R MLO tomo · tomo slice 44/87.0]
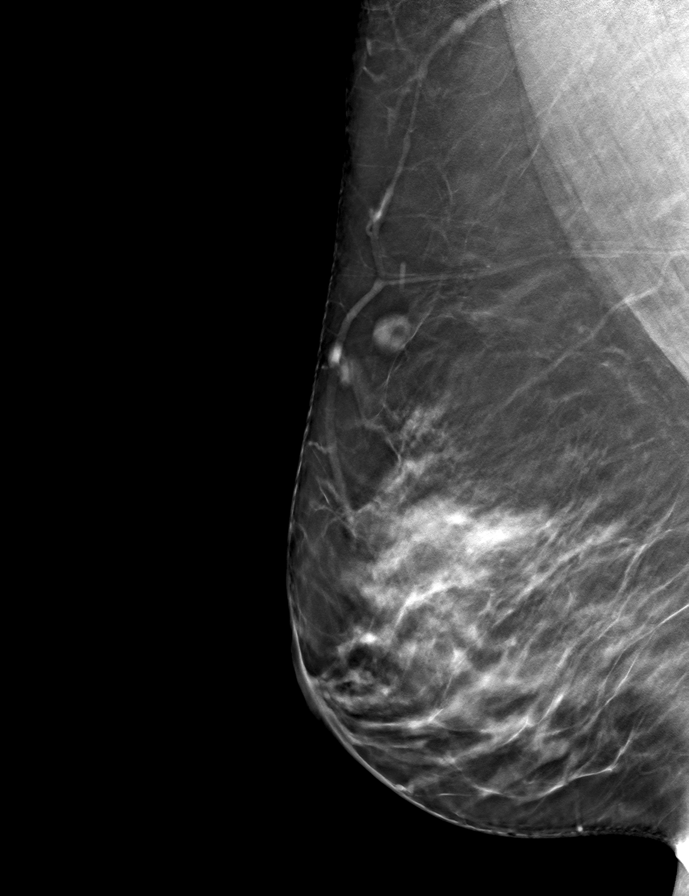

[9 of 24 positions shown; findings below may reference images not displayed]

ACR Breast Density Category b: There are scattered areas of
fibroglandular density.
FINDINGS: There are no findings suspicious for malignancy.
IMPRESSION: No mammographic evidence of malignancy. A result letter of this
screening mammogram will be mailed directly to the patient.

RECOMMENDATION:
Screening mammogram in one year. (Code:51-O-LD2)

BI-RADS CATEGORY  1: Negative.

## 2022-08-26 ENCOUNTER — Other Ambulatory Visit: Payer: Self-pay | Admitting: Family Medicine

## 2022-08-26 MED ORDER — XOFLUZA (40 MG DOSE) 1 X 40 MG PO TBPK: 40.0000 mg | ORAL_TABLET | Freq: Once | ORAL | 0 refills | Status: AC

## 2022-09-22 ENCOUNTER — Other Ambulatory Visit: Payer: Self-pay | Admitting: Nurse Practitioner

## 2022-09-22 ENCOUNTER — Other Ambulatory Visit: Payer: 59

## 2022-09-22 DIAGNOSIS — Z9229 Personal history of other drug therapy: Secondary | ICD-10-CM | POA: Diagnosis not present

## 2022-09-23 ENCOUNTER — Other Ambulatory Visit: Payer: Self-pay | Admitting: Nurse Practitioner

## 2022-09-23 ENCOUNTER — Encounter: Payer: Self-pay | Admitting: Nurse Practitioner

## 2022-09-23 ENCOUNTER — Ambulatory Visit (INDEPENDENT_AMBULATORY_CARE_PROVIDER_SITE_OTHER): Payer: 59 | Admitting: Nurse Practitioner

## 2022-09-23 VITALS — BP 126/72 | HR 78 | Temp 97.8°F | Ht 63.0 in | Wt 178.0 lb

## 2022-09-23 DIAGNOSIS — N3000 Acute cystitis without hematuria: Secondary | ICD-10-CM

## 2022-09-23 DIAGNOSIS — N3001 Acute cystitis with hematuria: Secondary | ICD-10-CM

## 2022-09-23 LAB — POCT URINALYSIS DIP (CLINITEK)
Bilirubin, UA: NEGATIVE
Glucose, UA: NEGATIVE mg/dL
Ketones, POC UA: NEGATIVE mg/dL
Nitrite, UA: NEGATIVE
Spec Grav, UA: 1.015 (ref 1.010–1.025)
Urobilinogen, UA: 0.2 E.U./dL
pH, UA: 6.5 (ref 5.0–8.0)

## 2022-09-23 LAB — MEASLES/MUMPS/RUBELLA IMMUNITY
MUMPS ABS, IGG: >300 AU/mL (ref >10.9)
RUBEOLA AB, IGG: 187 AU/mL (ref >16.4)
Rubella Antibodies, IGG: >33 index (ref >0.99)

## 2022-09-23 MED ORDER — NITROFURANTOIN MONOHYD MACRO 100 MG PO CAPS: 100.0000 mg | ORAL_CAPSULE | Freq: Two times a day (BID) | ORAL | 0 refills | Status: DC

## 2022-09-23 NOTE — Progress Notes (Signed)
Acute Office Visit  Subjective:    Patient ID: [name], female    DOB: [birth year], 47 y.o.   MRN: [id number]  CC: UTI symptoms  HPI: Patient is in today for UTI symptoms of dysuria, malodorous urine, and frequency. Onset of symptoms was yesterday. Denies fever, chills, or back pain. Treatment has included pushing fluids.   Urinary symptoms  She reports new onset cloudy malodorous urine, dysuria, hematuria, and urinary frequency. The current episode started yesterday and is worsening. Patient states symptoms are 5/10 in intensity, occurring intermittently. She  has not been recently treated for similar symptoms.       Past Medical History:  Diagnosis Date   AMA (advanced maternal age) multigravida 53+    Medical history non-contributory     Past Surgical History:  Procedure Laterality Date   CESAREAN SECTION  2016   NO PAST SURGERIES      Family History  Problem Relation Age of Onset   Diabetes Mother    High blood pressure Mother    Pulmonary fibrosis Mother    Diabetes Father    High blood pressure Father    High blood pressure Sister    Diabetes Paternal Grandmother    Diabetes Paternal Grandfather    Breast cancer Neg Hx     Social History   Socioeconomic History   Marital status: Single    Spouse name: Not on file   Number of children: 1   Years of education: Not on file   Highest education level: Bachelor's degree (e.g., BA, AB, BS)  Occupational History   Occupation: Psychologist, sport and exercise  Tobacco Use   Smoking status: Never   Smokeless tobacco: Never  Vaping Use   Vaping Use: Never used  Substance and Sexual Activity   Alcohol use: Yes    Alcohol/week: 2.0 standard drinks of alcohol    Types: 2 Cans of beer per week    Comment: BEER - OCCAS.WEEKENDS   Drug use: Never   Sexual activity: Yes    Birth control/protection: Patch    Comment: 1ST INTERCOURSE- 82, PARTNERS- 7  Other Topics Concern   Not on file  Social History Narrative    Not on file   Social Determinants of Health   Financial Resource Strain: Not on file  Food Insecurity: Not on file  Transportation Needs: Not on file  Physical Activity: Not on file  Stress: Not on file  Social Connections: Not on file  Intimate Partner Violence: Not on file    Outpatient Medications Prior to Visit  Medication Sig Dispense Refill   diphenoxylate-atropine (LOMOTIL) 2.5-0.025 MG tablet Take 1 tablet by mouth 4 (four) times daily as needed for diarrhea or loose stools. 30 tablet 0   ibuprofen (ADVIL) 600 MG tablet Take 1 tablet (600 mg total) by mouth every 8 (eight) hours as needed. 90 tablet 1   norelgestromin-ethinyl estradiol Marilu Favre) 150-35 MCG/24HR transdermal patch Place 1 patch onto the skin once a week. 9 patch 4   promethazine-dextromethorphan (PROMETHAZINE-DM) 6.25-15 MG/5ML syrup Take 5 mLs by mouth 4 (four) times daily as needed. 118 mL 0   Vitamin D, Ergocalciferol, (DRISDOL) 1.25 MG (50000 UNIT) CAPS capsule TAKE 1 CAPSULE (50,000 UNITS TOTAL) BY MOUTH EVERY 7 (SEVEN) DAYS 12 capsule 3   No facility-administered medications prior to visit.    No Known Allergies  Review of Systems See pertinent positives and negatives per HPI.     Objective:    Physical Exam Vitals reviewed.  Constitutional:  Appearance: Normal appearance.  Abdominal:     Tenderness: There is no abdominal tenderness. There is no right CVA tenderness or left CVA tenderness.  Skin:    General: Skin is warm.     Capillary Refill: Capillary refill takes less than 2 seconds.  Neurological:     Mental Status: She is alert and oriented to person, place, and time.  Psychiatric:        Behavior: Behavior normal.     BP 126/72   Pulse 78   Temp 97.8 F (36.6 C)   Ht 5\' 3"  (1.6 m)   Wt 178 lb (80.7 kg)   SpO2 98%   BMI 31.53 kg/m   Wt Readings from Last 3 Encounters:  07/08/22 179 lb (81.2 kg)  04/19/22 182 lb 6.4 oz (82.7 kg)  03/04/22 180 lb (81.6 kg)    Health  Maintenance Due  Topic Date Due   Hepatitis C Screening  Never done   COLONOSCOPY (Pts 45-49yrs Insurance coverage will need to be confirmed)  Never done   PAP SMEAR-Modifier  09/03/2022       Lab Results  Component Value Date   TSH 2.550 10/13/2021   Lab Results  Component Value Date   WBC 9.3 03/04/2022   HGB 12.9 03/04/2022   HCT 39.7 03/04/2022   MCV 86 03/04/2022   PLT 312 03/04/2022   Lab Results  Component Value Date   NA 140 03/04/2022   K 4.2 03/04/2022   CO2 22 03/04/2022   GLUCOSE 84 03/04/2022   BUN 10 03/04/2022   CREATININE 0.84 03/04/2022   BILITOT 0.4 03/04/2022   ALKPHOS 79 03/04/2022   AST 12 03/04/2022   ALT 10 03/04/2022   PROT 7.2 03/04/2022   ALBUMIN 4.2 03/04/2022   CALCIUM 9.0 03/04/2022   EGFR 87 03/04/2022   Lab Results  Component Value Date   CHOL 152 10/13/2021   Lab Results  Component Value Date   HDL 47 10/13/2021   Lab Results  Component Value Date   LDLCALC 89 10/13/2021   Lab Results  Component Value Date   TRIG 83 10/13/2021   Lab Results  Component Value Date   CHOLHDL 3.2 10/13/2021   Lab Results  Component Value Date   HGBA1C 5.5 10/13/2021       Assessment & Plan:    1. Acute cystitis with hematuria - POCT URINALYSIS DIP (CLINITEK) - Urine Culture - nitrofurantoin, macrocrystal-monohydrate, (MACROBID) 100 MG capsule; Take 1 capsule (100 mg total) by mouth 2 (two) times daily.  Dispense: 14 capsule; Refill: 0 -rest and push fluids -avoid holding urine -follow up as needed   Follow-up: PRN  An After Visit Summary was printed and given to the patient.  I, Rip Harbour, NP, have reviewed all documentation for this visit. The documentation on 09/23/22 for the exam, diagnosis, procedures, and orders are all accurate and complete.   Rip Harbour, NP [place] [phone]

## 2022-09-25 LAB — URINE CULTURE

## 2022-11-04 ENCOUNTER — Encounter: Payer: Self-pay | Admitting: Family Medicine

## 2022-11-04 ENCOUNTER — Ambulatory Visit (INDEPENDENT_AMBULATORY_CARE_PROVIDER_SITE_OTHER): Payer: 59 | Admitting: Family Medicine

## 2022-11-04 VITALS — BP 128/82 | HR 80 | Temp 97.3°F | Ht 63.0 in | Wt 179.0 lb

## 2022-11-04 DIAGNOSIS — J018 Other acute sinusitis: Secondary | ICD-10-CM

## 2022-11-04 MED ORDER — AZITHROMYCIN 250 MG PO TABS: ORAL_TABLET | ORAL | 0 refills | Status: DC

## 2022-11-04 NOTE — Progress Notes (Signed)
Subjective:  Patient ID: [name], female    DOB: 08-18-1976  Age: 47 y.o. MRN: [id number]  Chief Complaint  Patient presents with   Sinusitis    HPI  Upper respiratory symptoms She complains of post nasal drip and sinus pressure.with no fever, chills, night sweats or weight loss. Onset of symptoms was last night and worsening.She is drinking plenty of fluids.  Past history is significant for no history of pneumonia or bronchitis. Patient is non-smoker       02/25/2021    3:27 PM 10/20/2020    9:53 AM 04/16/2015    1:54 PM 03/06/2015    1:34 PM  Depression screen PHQ 2/9  Decreased Interest 0 0 0 0  Down, Depressed, Hopeless 0 0 0 0  PHQ - 2 Score 0 0 0 0         03/06/2015    1:34 PM 04/16/2015    1:54 PM 10/20/2020    1:23 PM 10/30/2020    1:59 PM 02/25/2021    3:27 PM  Fall Risk  Falls in the past year? No No 0 0 0  Was there an injury with Fall?   0 0 0  Fall Risk Category Calculator   0 0 0  Fall Risk Category (Retired)   Low Low Low  (RETIRED) Patient Fall Risk Level   Low fall risk Low fall risk Low fall risk  Patient at Risk for Falls Due to     No Fall Risks  Fall risk Follow up   Falls evaluation completed Falls evaluation completed Falls evaluation completed      Review of Systems  Constitutional:  Negative for fatigue.  HENT:  Positive for postnasal drip and sinus pressure. Negative for congestion, ear pain and sore throat.   Respiratory:  Negative for cough and shortness of breath.   Cardiovascular:  Negative for chest pain.  Gastrointestinal:  Negative for abdominal pain, constipation, diarrhea, nausea and vomiting.  Genitourinary:  Negative for dysuria, frequency and urgency.  Musculoskeletal:  Negative for arthralgias, back pain and myalgias.  Neurological:  Negative for dizziness and headaches.  Psychiatric/Behavioral:  Negative for agitation and sleep disturbance. The patient is not nervous/anxious.     Current Outpatient Medications on File  Prior to Visit  Medication Sig Dispense Refill   ibuprofen (ADVIL) 600 MG tablet Take 1 tablet (600 mg total) by mouth every 8 (eight) hours as needed. 90 tablet 1   norelgestromin-ethinyl estradiol Marilu Favre) 150-35 MCG/24HR transdermal patch Place 1 patch onto the skin once a week. 9 patch 4   Vitamin D, Ergocalciferol, (DRISDOL) 1.25 MG (50000 UNIT) CAPS capsule TAKE 1 CAPSULE (50,000 UNITS TOTAL) BY MOUTH EVERY 7 (SEVEN) DAYS 12 capsule 3   No current facility-administered medications on file prior to visit.   Past Medical History:  Diagnosis Date   AMA (advanced maternal age) multigravida 85+    Medical history non-contributory    Past Surgical History:  Procedure Laterality Date   CESAREAN SECTION  2016   NO PAST SURGERIES      Family History  Problem Relation Age of Onset   Diabetes Mother    High blood pressure Mother    Pulmonary fibrosis Mother    Diabetes Father    High blood pressure Father    High blood pressure Sister    Diabetes Paternal Grandmother    Diabetes Paternal Grandfather    Breast cancer Neg Hx    Social History   Socioeconomic History  Marital status: Single    Spouse name: Not on file   Number of children: 1   Years of education: Not on file   Highest education level: Bachelor's degree (e.g., BA, AB, BS)  Occupational History   Occupation: Psychologist, sport and exercise  Tobacco Use   Smoking status: Never   Smokeless tobacco: Never  Vaping Use   Vaping Use: Never used  Substance and Sexual Activity   Alcohol use: Yes    Alcohol/week: 2.0 standard drinks of alcohol    Types: 2 Cans of beer per week    Comment: BEER - [email or web address]   Drug use: Never   Sexual activity: Yes    Birth control/protection: Patch    Comment: 1ST INTERCOURSE- 66, PARTNERS- 7  Other Topics Concern   Not on file  Social History Narrative   Not on file   Social Determinants of Health   Financial Resource Strain: Not on file  Food Insecurity: Not on file   Transportation Needs: Not on file  Physical Activity: Not on file  Stress: Not on file  Social Connections: Not on file    Objective:  BP 128/82 (BP Location: Left Arm, Patient Position: Sitting, Cuff Size: Large)   Pulse 80   Temp (!) 97.3 F (36.3 C) (Temporal)   Ht '5\' 3"'$  (1.6 m)   Wt 179 lb (81.2 kg)   SpO2 98%   BMI 31.71 kg/m      11/04/2022   10:58 AM 09/23/2022    3:34 PM 07/08/2022    3:07 PM  BP/Weight  Systolic BP 0000000 [id number]  Diastolic BP 82 72 82  Wt. (Lbs) 179 178 179  BMI 31.71 kg/m2 31.53 kg/m2 31.71 kg/m2    Physical Exam Vitals reviewed.  Constitutional:      Appearance: Normal appearance.  HENT:     Right Ear: Tympanic membrane normal.     Left Ear: Tympanic membrane normal.     Nose: Congestion present.     Comments: Left max sinus tenderness    Mouth/Throat:     Pharynx: No oropharyngeal exudate or posterior oropharyngeal erythema.  Cardiovascular:     Rate and Rhythm: Normal rate and regular rhythm.     Heart sounds: Normal heart sounds.  Pulmonary:     Effort: Pulmonary effort is normal.     Breath sounds: Normal breath sounds.  Musculoskeletal:     Cervical back: Normal range of motion.  Lymphadenopathy:     Cervical: No cervical adenopathy.  Neurological:     Mental Status: She is alert.  Psychiatric:        Mood and Affect: Mood normal.        Behavior: Behavior normal.     Diabetic Foot Exam - Simple   No data filed      Lab Results  Component Value Date   WBC 9.3 03/04/2022   HGB 12.9 03/04/2022   HCT 39.7 03/04/2022   PLT 312 03/04/2022   GLUCOSE 84 03/04/2022   CHOL 152 10/13/2021   TRIG 83 10/13/2021   HDL 47 10/13/2021   LDLCALC 89 10/13/2021   ALT 10 03/04/2022   AST 12 03/04/2022   NA 140 03/04/2022   K 4.2 03/04/2022   CL 103 03/04/2022   CREATININE 0.84 03/04/2022   BUN 10 03/04/2022   CO2 22 03/04/2022   TSH 2.550 10/13/2021   HGBA1C 5.5 10/13/2021      Assessment & Plan:    Acute  non-recurrent sinusitis of other sinus -  Azithromycin; Take 2 tablets a day for 3 days  Dispense: 6 each; Refill: 0     Meds ordered this encounter  Medications   azithromycin (ZITHROMAX Z-PAK) 250 MG tablet    Sig: Take 2 tablets a day for 3 days    Dispense:  6 each    Refill:  0    No orders of the defined types were placed in this encounter.    Follow-up: No follow-ups on file.   I,Jacqua L Marsh,acting as a scribe for Rochel Brome, MD.,have documented all relevant documentation on the behalf of Rochel Brome, MD,as directed by  Rochel Brome, MD while in the presence of Rochel Brome, MD.   An After Visit Summary was printed and given to the patient.  Rochel Brome, MD [facility] [phone]

## 2022-12-09 ENCOUNTER — Ambulatory Visit: Payer: PRIVATE HEALTH INSURANCE | Admitting: Nurse Practitioner

## 2022-12-13 ENCOUNTER — Ambulatory Visit (INDEPENDENT_AMBULATORY_CARE_PROVIDER_SITE_OTHER): Payer: 59 | Admitting: Nurse Practitioner

## 2022-12-13 ENCOUNTER — Encounter: Payer: Self-pay | Admitting: Nurse Practitioner

## 2022-12-13 VITALS — BP 128/82 | HR 89 | Ht 62.6 in | Wt 178.0 lb

## 2022-12-13 DIAGNOSIS — Z113 Encounter for screening for infections with a predominantly sexual mode of transmission: Secondary | ICD-10-CM

## 2022-12-13 DIAGNOSIS — N898 Other specified noninflammatory disorders of vagina: Secondary | ICD-10-CM

## 2022-12-13 DIAGNOSIS — Z01419 Encounter for gynecological examination (general) (routine) without abnormal findings: Secondary | ICD-10-CM

## 2022-12-13 DIAGNOSIS — R109 Unspecified abdominal pain: Secondary | ICD-10-CM

## 2022-12-13 DIAGNOSIS — Z3045 Encounter for surveillance of transdermal patch hormonal contraceptive device: Secondary | ICD-10-CM

## 2022-12-13 DIAGNOSIS — R10A0 Flank pain, unspecified side: Secondary | ICD-10-CM

## 2022-12-13 DIAGNOSIS — Z87898 Personal history of other specified conditions: Secondary | ICD-10-CM

## 2022-12-13 DIAGNOSIS — N760 Acute vaginitis: Secondary | ICD-10-CM | POA: Diagnosis not present

## 2022-12-13 DIAGNOSIS — Z833 Family history of diabetes mellitus: Secondary | ICD-10-CM

## 2022-12-13 LAB — URINALYSIS, COMPLETE W/RFL CULTURE
Bacteria, UA: NONE SEEN /HPF
Bilirubin Urine: NEGATIVE
Glucose, UA: NEGATIVE
Hyaline Cast: NONE SEEN /LPF
Ketones, ur: NEGATIVE
Leukocyte Esterase: NEGATIVE
Nitrites, Initial: NEGATIVE
Protein, ur: NEGATIVE
RBC / HPF: NONE SEEN /HPF (ref 0–2)
Specific Gravity, Urine: 1.01 (ref 1.001–1.035)
WBC, UA: NONE SEEN /HPF (ref 0–5)
pH: 7 (ref 5.0–8.0)

## 2022-12-13 LAB — NO CULTURE INDICATED

## 2022-12-13 LAB — WET PREP FOR TRICH, YEAST, CLUE

## 2022-12-13 MED ORDER — NORELGESTROMIN-ETH ESTRADIOL 150-35 MCG/24HR TD PTWK: 1.0000 | MEDICATED_PATCH | TRANSDERMAL | 4 refills | Status: DC

## 2022-12-13 NOTE — Progress Notes (Signed)
[name] 1976-04-09 [id number]   History:  47 y.o. G1P1001 presents for annual exam. Monthly cycles/Xulane patches. Normal pap and mammogram history. Mild burning with urination and lower back pain x 2 weeks, some mild vaginal itching and pain with intercourse. On high dose Vit D.   Gynecologic History Patient's last menstrual period was 12/09/2022. Period Cycle (Days): 28 Period Duration (Days): 2-3 Period Pattern: Regular Menstrual Flow: Light Menstrual Control: Thin pad Menstrual Control Change Freq (Hours): 6 Dysmenorrhea: (!) Moderate Dysmenorrhea Symptoms: Cramping, Diarrhea Contraception: Ortho-Evra patches weekly Sexually active: Yes, would like STD screening today  Health maintenance Last Pap: 09/04/2019. Results were: Normal neg HPV, 5-year repeat Last mammogram: 12/30/2021. Results were: Normal Last colonoscopy: Never. Negative Cologuard 12/12/2021 Last Dexa: Never  Past medical history, past surgical history, family history and social history were all reviewed and documented in the EPIC chart. 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 7 yo daughter, in 1st grade at [employer]. Significant family history of diabetes and HTN. Mother and grandmother with osteoporosis.   ROS:  A ROS was performed and pertinent positives and negatives are included.  Exam:  Vitals:   12/13/22 0917  BP: 128/82  Pulse: 89  SpO2: 100%  Weight: 178 lb (80.7 kg)  Height: 5' 2.6" (1.59 m)     Body mass index is 31.94 kg/m.  General appearance:  Normal Thyroid:  Symmetrical, normal in size, without palpable masses or nodularity. Respiratory  Auscultation:  Clear without wheezing or rhonchi Cardiovascular  Auscultation:  Regular rate, without rubs, murmurs or gallops  Edema/varicosities:  Not grossly evident Abdominal  Soft,nontender, without masses, guarding or rebound.  Liver/spleen:  No organomegaly noted  Hernia:  None  appreciated  Skin  Inspection:  Grossly normal   Breasts: Examined lying and sitting.   Right: Without masses, retractions, discharge or axillary adenopathy.   Left: Without masses, retractions, discharge or axillary adenopathy. Genitourinary   Inguinal/mons:  Normal without inguinal adenopathy  External genitalia:  Normal appearing vulva with no masses, tenderness, or lesions  BUS/Urethra/Skene's glands:  Normal  Vagina:  Normal appearing with normal color and discharge, no lesions  Cervix:  Normal appearing without discharge or lesions  Uterus:  Normal in size, shape and contour.  Midline and mobile, nontender  Adnexa/parametria:     Rt: Normal in size, without masses or tenderness.   Lt: Normal in size, without masses or tenderness.  Anus and perineum: Normal  Digital rectal exam: Deferred  Patient informed chaperone available to be present for breast and pelvic exam. Patient has requested no chaperone to be present. Patient has been advised what will be completed during breast and pelvic exam.   UA negative Wet prep negative for pathogens, many bacteria present  Assessment/Plan:  47 y.o. G1P1001 for annual exam.   Well female exam with routine gynecological exam - Plan: CBC with Differential/Platelet, Comprehensive metabolic panel, Lipid panel. Education provided on SBEs, importance of preventative screenings, current guidelines, high calcium diet, regular exercise, and multivitamin daily.   Encounter for surveillance of transdermal patch hormonal contraceptive device - Plan: norelgestromin-ethinyl estradiol Burr Medico) 150-35 MCG/24HR transdermal patch weekly. Using as prescribed. Refill x 1 year provided.   Flank pain - Plan: Urinalysis,Complete w/RFL Culture. Negative UA.   Vaginal itching - Plan: WET PREP FOR TRICH, YEAST, CLUE. Wet prep negative for pathogens, many bacteria present.   Family history of diabetes mellitus - Plan: Hemoglobin A1c  History of  prediabetes -  Plan: Hemoglobin A1c  Screening examination for STD (sexually transmitted disease) - Plan: C. trachomatis/N. gonorrhoeae RNA  Screening for cervical cancer - Normal Pap history.  Will repeat at 5-year interval per guidelines.  Screening for colon cancer - Negative Cologuard 12/2021. Will repeat at 3-year interval per recommendation.   Screening for breast cancer - Normal mammogram history.  Continue annual screenings. Normal breast exam today.   Follow up in 1 year for annual.      [name] [facility], 9:47 AM 12/13/2022

## 2022-12-14 LAB — CBC WITH DIFFERENTIAL/PLATELET
Absolute Monocytes: 572 cells/uL (ref 200–950)
MCH: 28.3 pg (ref 27.0–33.0)
Neutro Abs: 4431 cells/uL (ref 1500–7800)
Platelets: 360 10*3/uL (ref 140–400)

## 2022-12-14 LAB — COMPREHENSIVE METABOLIC PANEL
AG Ratio: 1.5 (calc) (ref 1.0–2.5)
Alkaline phosphatase (APISO): 71 U/L (ref 31–125)
Glucose, Bld: 88 mg/dL (ref 65–99)
Sodium: 138 mmol/L (ref 135–146)

## 2022-12-14 LAB — LIPID PANEL
Cholesterol: 164 mg/dL (ref <200)
LDL Cholesterol (Calc): 80 mg/dL (calc)
Triglycerides: 107 mg/dL (ref <150)

## 2022-12-14 LAB — C. TRACHOMATIS/N. GONORRHOEAE RNA
C. trachomatis RNA, TMA: NOT DETECTED
N. gonorrhoeae RNA, TMA: NOT DETECTED

## 2022-12-15 LAB — CBC WITH DIFFERENTIAL/PLATELET
Basophils Absolute: 42 cells/uL (ref 0–200)
Basophils Relative: 0.4 %
Eosinophils Absolute: 339 cells/uL (ref 15–500)
Eosinophils Relative: 3.2 %
HCT: 39.1 % (ref 35.0–45.0)
Hemoglobin: 13 g/dL (ref 11.7–15.5)
Lymphs Abs: 5215 cells/uL — ABNORMAL HIGH (ref 850–3900)
MCHC: 33.2 g/dL (ref 32.0–36.0)
MCV: 85.2 fL (ref 80.0–100.0)
MPV: 10.9 fL (ref 7.5–12.5)
Monocytes Relative: 5.4 %
Neutrophils Relative %: 41.8 %
RBC: 4.59 10*6/uL (ref 3.80–5.10)
RDW: 12.3 % (ref 11.0–15.0)
Total Lymphocyte: 49.2 %
WBC: 10.6 10*3/uL (ref 3.8–10.8)

## 2022-12-15 LAB — COMPREHENSIVE METABOLIC PANEL
ALT: 9 U/L (ref 6–29)
AST: 11 U/L (ref 10–35)
Albumin: 4.2 g/dL (ref 3.6–5.1)
BUN: 12 mg/dL (ref 7–25)
CO2: 26 mmol/L (ref 20–32)
Calcium: 9.1 mg/dL (ref 8.6–10.2)
Chloride: 105 mmol/L (ref 98–110)
Creat: 0.74 mg/dL (ref 0.50–0.99)
Globulin: 2.8 g/dL (calc) (ref 1.9–3.7)
Potassium: 4.1 mmol/L (ref 3.5–5.3)
Total Bilirubin: 0.5 mg/dL (ref 0.2–1.2)
Total Protein: 7 g/dL (ref 6.1–8.1)

## 2022-12-15 LAB — LIPID PANEL
HDL: 64 mg/dL (ref >=50)
Non-HDL Cholesterol (Calc): 100 mg/dL (calc) (ref <130)
Total CHOL/HDL Ratio: 2.6 (calc) (ref <5.0)

## 2022-12-15 LAB — HEMOGLOBIN A1C
Hgb A1c MFr Bld: 5.5 % of total Hgb (ref <5.7)
Mean Plasma Glucose: 111 mg/dL
eAG (mmol/L): 6.2 mmol/L

## 2022-12-16 ENCOUNTER — Other Ambulatory Visit: Payer: Self-pay | Admitting: Nurse Practitioner

## 2022-12-16 DIAGNOSIS — D72820 Lymphocytosis (symptomatic): Secondary | ICD-10-CM

## 2023-01-10 ENCOUNTER — Other Ambulatory Visit: Payer: 59

## 2023-01-10 DIAGNOSIS — D72820 Lymphocytosis (symptomatic): Secondary | ICD-10-CM

## 2023-01-10 LAB — CBC WITH DIFFERENTIAL/PLATELET
Absolute Monocytes: 420 cells/uL (ref 200–950)
Basophils Absolute: 63 cells/uL (ref 0–200)
Basophils Relative: 0.6 %
Eosinophils Absolute: 179 cells/uL (ref 15–500)
Eosinophils Relative: 1.7 %
HCT: 36 % (ref 35.0–45.0)
Hemoglobin: 11.5 g/dL — ABNORMAL LOW (ref 11.7–15.5)
Lymphs Abs: 4431 cells/uL — ABNORMAL HIGH (ref 850–3900)
MCH: 27.7 pg (ref 27.0–33.0)
MCHC: 31.9 g/dL — ABNORMAL LOW (ref 32.0–36.0)
MCV: 86.7 fL (ref 80.0–100.0)
MPV: 10.9 fL (ref 7.5–12.5)
Monocytes Relative: 4 %
Neutro Abs: 5408 cells/uL (ref 1500–7800)
Neutrophils Relative %: 51.5 %
Platelets: 330 10*3/uL (ref 140–400)
RBC: 4.15 10*6/uL (ref 3.80–5.10)
RDW: 12.4 % (ref 11.0–15.0)
Total Lymphocyte: 42.2 %
WBC: 10.5 10*3/uL (ref 3.8–10.8)

## 2023-01-11 ENCOUNTER — Other Ambulatory Visit: Payer: Self-pay | Admitting: Nurse Practitioner

## 2023-01-11 DIAGNOSIS — D72820 Lymphocytosis (symptomatic): Secondary | ICD-10-CM

## 2023-01-12 NOTE — Telephone Encounter (Signed)
The location of the referral was updated. Please follow up on this when able. Thanks.

## 2023-01-14 NOTE — Telephone Encounter (Signed)
Will route to provider for final review and close. BLV to f/u on referral from work queue.

## 2023-01-24 ENCOUNTER — Encounter: Payer: Self-pay | Admitting: Nurse Practitioner

## 2023-01-24 DIAGNOSIS — R319 Hematuria, unspecified: Secondary | ICD-10-CM

## 2023-01-24 NOTE — Telephone Encounter (Signed)
Msg forward to appt desk. Per CS: "Left message for patient to call and schedule appointment."

## 2023-01-24 NOTE — Telephone Encounter (Signed)
Please have her schedule OV for urinalysis. If no appointments available she can leave sample. Thanks.

## 2023-01-26 NOTE — Telephone Encounter (Signed)
Per DM: "LAB only 5/24 @2 :00." Orders placed. Will close encounter.

## 2023-01-28 ENCOUNTER — Telehealth: Payer: Self-pay | Admitting: *Deleted

## 2023-01-28 ENCOUNTER — Other Ambulatory Visit: Payer: 59

## 2023-01-28 DIAGNOSIS — R319 Hematuria, unspecified: Secondary | ICD-10-CM

## 2023-01-28 LAB — URINALYSIS, COMPLETE W/RFL CULTURE
Hyaline Cast: NONE SEEN /LPF
Ketones, ur: NEGATIVE
Nitrites, Initial: NEGATIVE
RBC / HPF: NONE SEEN /HPF (ref 0–2)

## 2023-01-28 NOTE — Telephone Encounter (Signed)
UA negative -reviewed by Dr. Seymour Bars.   Patient notified. Advised patient our office will f/u once UC results return. Patient verbalizes understanding and is agreeable.  Routing to [place], NP

## 2023-01-30 LAB — URINALYSIS, COMPLETE W/RFL CULTURE
Bilirubin Urine: NEGATIVE
Casts: NONE SEEN /LPF
Crystals: NONE SEEN /HPF
Glucose, UA: NEGATIVE
Leukocyte Esterase: NEGATIVE
Protein, ur: NEGATIVE
Specific Gravity, Urine: 1.01 (ref 1.001–1.035)
Yeast: NONE SEEN /HPF
pH: 6 (ref 5.0–8.0)

## 2023-01-30 LAB — URINE CULTURE
MICRO NUMBER:: 15000971
SPECIMEN QUALITY:: ADEQUATE

## 2023-01-30 LAB — CULTURE INDICATED

## 2023-02-01 ENCOUNTER — Other Ambulatory Visit: Payer: Self-pay | Admitting: Nurse Practitioner

## 2023-02-01 ENCOUNTER — Encounter: Payer: Self-pay | Admitting: Nurse Practitioner

## 2023-02-01 DIAGNOSIS — N3000 Acute cystitis without hematuria: Secondary | ICD-10-CM

## 2023-02-01 MED ORDER — NITROFURANTOIN MONOHYD MACRO 100 MG PO CAPS
100.0000 mg | ORAL_CAPSULE | Freq: Two times a day (BID) | ORAL | 0 refills | Status: DC
Start: 2023-02-01 — End: 2023-03-25

## 2023-02-02 NOTE — Telephone Encounter (Signed)
See result note dated 02/01/23.  Encounter closed.

## 2023-02-11 ENCOUNTER — Other Ambulatory Visit: Payer: Self-pay | Admitting: Oncology

## 2023-02-11 ENCOUNTER — Encounter: Payer: Self-pay | Admitting: Oncology

## 2023-02-11 ENCOUNTER — Inpatient Hospital Stay: Payer: 59

## 2023-02-11 ENCOUNTER — Inpatient Hospital Stay: Payer: 59 | Attending: Oncology | Admitting: Oncology

## 2023-02-11 VITALS — BP 141/71 | HR 75 | Temp 99.3°F | Resp 14 | Ht 63.0 in | Wt 188.5 lb

## 2023-02-11 DIAGNOSIS — D72820 Lymphocytosis (symptomatic): Secondary | ICD-10-CM | POA: Diagnosis not present

## 2023-02-11 DIAGNOSIS — Z806 Family history of leukemia: Secondary | ICD-10-CM

## 2023-02-11 DIAGNOSIS — R319 Hematuria, unspecified: Secondary | ICD-10-CM | POA: Diagnosis not present

## 2023-02-11 LAB — CBC AND DIFFERENTIAL
HCT: 37 (ref 36–46)
Hemoglobin: 12.1 (ref 12.0–16.0)
Neutrophils Absolute: 7.21
Platelets: 340 10*3/uL (ref 150–400)
WBC: 13.1

## 2023-02-11 LAB — CBC: RBC: 4.34 (ref 3.87–5.11)

## 2023-02-11 NOTE — Progress Notes (Signed)
[facility]  [address] [place],  [place]  [address] [phone]  Clinic Day:  02/11/2023  Referring physician: [name], NP   HISTORY OF PRESENT ILLNESS:  The patient is a 47 y.o. female who I was asked to consult upon for relattive lymphocytosis.  Recent labs showed a white count of 10.5, with her white count differential showing 51.5% neutorphils and 42.2% lymphocytes.  A previous CBC in April 2024 showed a total white count of 10.6, with 49% lymphocytes and 41.8% neutrophils per her white count differential.  The patient claims she had a urinary tract infection this week.  She also claims to have had a sinus infection 1 month ago.  She denies having any B symptoms which concern her for a potential hematologic malignancy being present.  Her family is pertinent for a paternal uncle who had leukemia.  He ultimately needed a bone marrow transplant to cure him of this disease.  PAST MEDICAL HISTORY:   Past Medical History:  Diagnosis Date   AMA (advanced maternal age) multigravida 35+    Medical history non-contributory     PAST SURGICAL HISTORY:   Past Surgical History:  Procedure Laterality Date   CESAREAN SECTION  2016   NO PAST SURGERIES      CURRENT MEDICATIONS:   Current Outpatient Medications  Medication Sig Dispense Refill   fluconazole (DIFLUCAN) 150 MG tablet Take 1 tablet (150 mg total) by mouth every 3 (three) days. 2 tablet 0   ibuprofen (ADVIL) 600 MG tablet Take 1 tablet (600 mg total) by mouth every 8 (eight) hours as needed. 90 tablet 1   Multiple Vitamin (MULTIVITAMIN) tablet Take 1 tablet by mouth daily.     nitrofurantoin, macrocrystal-monohydrate, (MACROBID) 100 MG capsule Take 1 capsule (100 mg total) by mouth 2 (two) times daily. 14 capsule 0   norelgestromin-ethinyl estradiol (XULANE) 150-35 MCG/24HR transdermal patch Place 1 patch onto the skin once a week. 9 patch 4   Vitamin D, Ergocalciferol, (DRISDOL)  1.25 MG (50000 UNIT) CAPS capsule TAKE 1 CAPSULE (50,000 UNITS TOTAL) BY MOUTH EVERY 7 (SEVEN) DAYS 12 capsule 3   No current facility-administered medications for this visit.    ALLERGIES:  No Known Allergies  FAMILY HISTORY:   Family History  Problem Relation Age of Onset   Diabetes Mother    High blood pressure Mother    Pulmonary fibrosis Mother    Diabetes Father    High blood pressure Father    High blood pressure Sister    Iron deficiency Sister    Diabetes Sister    Leukemia Paternal Uncle    Diabetes Paternal Grandmother    Diabetes Paternal Grandfather    Breast cancer Neg Hx     SOCIAL HISTORY:  The patient was [place]  She currently lives in town.  She is divorced, with 1 child.  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  There is no history of smoking.  She drinks alcohol occasionally.  REVIEW OF SYSTEMS:  Review of Systems  Constitutional:  Negative for fatigue and fever.  HENT:   Positive for hearing loss. Negative for sore throat.   Eyes:  Negative for eye problems.  Respiratory:  Negative for chest tightness, cough and hemoptysis.   Cardiovascular:  Negative for chest pain and palpitations.  Gastrointestinal:  Positive for constipation. Negative for abdominal distention, abdominal pain, blood in stool, diarrhea, nausea and vomiting.  Endocrine: Negative for hot flashes.  Genitourinary:  Negative for difficulty urinating, dysuria, frequency, hematuria and nocturia.   Musculoskeletal:  Positive for arthralgias. Negative for back pain, gait problem and myalgias.  Skin: Negative.  Negative for itching and rash.  Neurological: Negative.  Negative for dizziness, extremity weakness, gait problem, headaches, light-headedness and numbness.  Hematological: Negative.   Psychiatric/Behavioral: Negative.  Negative for depression and suicidal ideas. The patient is not nervous/anxious.      PHYSICAL EXAM:  Blood  pressure (!) 141/71, pulse 75, temperature 99.3 F (37.4 C), resp. rate 14, height 5\' 3"  (1.6 m), weight 188 lb 8 oz (85.5 kg), SpO2 98%, unknown if currently breastfeeding. Wt Readings from Last 3 Encounters:  03/14/23 189 lb 12.8 oz (86.1 kg)  02/11/23 188 lb 8 oz (85.5 kg)  12/13/22 178 lb (80.7 kg)   Body mass index is 33.39 kg/m. Performance status (ECOG): 0 - Asymptomatic Physical Exam Constitutional:      Appearance: Normal appearance. She is not ill-appearing.  HENT:     Mouth/Throat:     Mouth: Mucous membranes are moist.     Pharynx: Oropharynx is clear. No oropharyngeal exudate or posterior oropharyngeal erythema.  Cardiovascular:     Rate and Rhythm: Normal rate and regular rhythm.     Heart sounds: No murmur heard.    No friction rub. No gallop.  Pulmonary:     Effort: Pulmonary effort is normal. No respiratory distress.     Breath sounds: Normal breath sounds. No wheezing, rhonchi or rales.  Abdominal:     General: Bowel sounds are normal. There is no distension.     Palpations: Abdomen is soft. There is no mass.     Tenderness: There is no abdominal tenderness.  Musculoskeletal:        General: No swelling.     Right lower leg: No edema.     Left lower leg: No edema.  Lymphadenopathy:     Cervical: No cervical adenopathy.     Upper Body:     Right upper body: No supraclavicular or axillary adenopathy.     Left upper body: No supraclavicular or axillary adenopathy.     Lower Body: No right inguinal adenopathy. No left inguinal adenopathy.  Skin:    General: Skin is warm.     Coloration: Skin is not jaundiced.     Findings: No lesion or rash.  Neurological:     General: No focal deficit present.     Mental Status: She is alert and oriented to person, place, and time. Mental status is at baseline.  Psychiatric:        Mood and Affect: Mood normal.        Behavior: Behavior normal.        Thought Content: Thought content normal.     LABS:        Latest Ref Rng & Units 03/14/2023    2:47 PM 02/11/2023   12:00 AM 01/10/2023   11:07 AM  CBC  WBC 4.0 - 10.5 K/uL 11.9  13.1     10.5   Hemoglobin 12.0 - 15.0 g/dL 16.1  09.6     04.5   Hematocrit 36.0 - 46.0 % 40.9  37     36.0   Platelets 150 - 400 K/uL 397  340     330      This result is from an external source.      Latest Ref Rng & Units 12/13/2022    9:46  AM 03/04/2022    9:12 AM 10/13/2021    8:03 AM  CMP  Glucose 65 - 99 mg/dL 88  84  78   BUN 7 - 25 mg/dL 12  10  8    Creatinine 0.50 - 0.99 mg/dL 5.36  6.44  0.34   Sodium 135 - 146 mmol/L 138  140  140   Potassium 3.5 - 5.3 mmol/L 4.1  4.2  4.5   Chloride 98 - 110 mmol/L 105  103  106   CO2 20 - 32 mmol/L 26  22  22    Calcium 8.6 - 10.2 mg/dL 9.1  9.0  9.0   Total Protein 6.1 - 8.1 g/dL 7.0  7.2  6.8   Total Bilirubin 0.2 - 1.2 mg/dL 0.5  0.4  0.3   Alkaline Phos 44 - 121 IU/L  79  73   AST 10 - 35 U/L 11  12  10    ALT 6 - 29 U/L 9  10  6     ASSESSMENT & PLAN:  A 47 y.o. female who I was asked to consult upon for relative lymphocytosis.  Although her white count is mildly elevated today, her white count differential is completely normal.  Her total absolute lymphocyte count is mildly elevated, but I am not particularly concerned that this represents any anomalous hematologic disorder.  For completeness, I will send her peripheral blood for flow cytometry to ensure an underlying lymphoproliferative disorder is not present.  I will see her back in 1 month to review her flow cytometry results and their implications.  The patient understands all the plans discussed today and is in agreement with them.  I do appreciate [name], NP for his new consult.   Deontaye Civello Kirby Funk, MD

## 2023-02-11 NOTE — Progress Notes (Unsigned)
[facility]  [address] [place],  [place]  [address] [phone]  Clinic Day:  02/11/2023  Referring physician: No ref. provider found   HISTORY OF PRESENT ILLNESS:  The patient is a 47 y.o. female  *** who I was asked to consult upon for ***   PAST MEDICAL HISTORY:   Past Medical History:  Diagnosis Date   AMA (advanced maternal age) multigravida 35+    Medical history non-contributory     PAST SURGICAL HISTORY:   Past Surgical History:  Procedure Laterality Date   CESAREAN SECTION  2016   NO PAST SURGERIES      CURRENT MEDICATIONS:   Current Outpatient Medications  Medication Sig Dispense Refill   ibuprofen (ADVIL) 600 MG tablet Take 1 tablet (600 mg total) by mouth every 8 (eight) hours as needed. 90 tablet 1   Multiple Vitamin (MULTIVITAMIN) tablet Take 1 tablet by mouth daily.     nitrofurantoin, macrocrystal-monohydrate, (MACROBID) 100 MG capsule Take 1 capsule (100 mg total) by mouth 2 (two) times daily. 14 capsule 0   norelgestromin-ethinyl estradiol (XULANE) 150-35 MCG/24HR transdermal patch Place 1 patch onto the skin once a week. 9 patch 4   Vitamin D, Ergocalciferol, (DRISDOL) 1.25 MG (50000 UNIT) CAPS capsule TAKE 1 CAPSULE (50,000 UNITS TOTAL) BY MOUTH EVERY 7 (SEVEN) DAYS 12 capsule 3   No current facility-administered medications for this visit.    ALLERGIES:  No Known Allergies  FAMILY HISTORY:   Family History  Problem Relation Age of Onset   Diabetes Mother    High blood pressure Mother    Pulmonary fibrosis Mother    Diabetes Father    High blood pressure Father    High blood pressure Sister    Diabetes Paternal Grandmother    Diabetes Paternal Grandfather    Breast cancer Neg Hx     SOCIAL HISTORY:   reports that she has never smoked. She has never used smokeless tobacco. She reports current alcohol use of about 2.0 standard drinks of alcohol per week. She reports that she does not use  drugs.  REVIEW OF SYSTEMS:  Review of Systems - Oncology   PHYSICAL EXAM:  unknown if currently breastfeeding. Wt Readings from Last 3 Encounters:  12/13/22 178 lb (80.7 kg)  11/04/22 179 lb (81.2 kg)  09/23/22 178 lb (80.7 kg)   There is no height or weight on file to calculate BMI. Performance status (ECOG): {CHL ONC [id number] Physical Exam  LABS:      Latest Ref Rng & Units 01/10/2023   11:07 AM 12/13/2022    9:46 AM 03/04/2022    9:12 AM  CBC  WBC 3.8 - 10.8 Thousand/uL 10.5  10.6  9.3   Hemoglobin 11.7 - 15.5 g/dL 64.4  03.4  74.2   Hematocrit 35.0 - 45.0 % 36.0  39.1  39.7   Platelets 140 - 400 Thousand/uL 330  360  312       Latest Ref Rng & Units 12/13/2022    9:46 AM 03/04/2022    9:12 AM 10/13/2021    8:03 AM  CMP  Glucose 65 - 99 mg/dL 88  84  78   BUN 7 - 25 mg/dL 12  10  8    Creatinine 0.50 - 0.99 mg/dL 5.95  6.38  7.56   Sodium 135 - 146 mmol/L 138  140  140   Potassium 3.5 - 5.3 mmol/L 4.1  4.2  4.5   Chloride 98 - 110  mmol/L 105  103  106   CO2 20 - 32 mmol/L 26  22  22    Calcium 8.6 - 10.2 mg/dL 9.1  9.0  9.0   Total Protein 6.1 - 8.1 g/dL 7.0  7.2  6.8   Total Bilirubin 0.2 - 1.2 mg/dL 0.5  0.4  0.3   Alkaline Phos 44 - 121 IU/L  79  73   AST 10 - 35 U/L 11  12  10    ALT 6 - 29 U/L 9  10  6       No results found for: "CEA1", "CEA" / No results found for: "CEA1", "CEA" No results found for: "PSA1" No results found for: "ZOX096" No results found for: "CAN125"  No results found for: "TOTALPROTELP", "ALBUMINELP", "A1GS", "A2GS", "BETS", "BETA2SER", "GAMS", "MSPIKE", "SPEI" Lab Results  Component Value Date   TIBC 334 03/04/2022   FERRITIN 110 03/04/2022   IRONPCTSAT 22 03/04/2022   No results found for: "LDH"  No results found for: "AFPTUMOR", "TOTALPROTELP", "ALBUMINELP", "A1GS", "A2GS", "BETS", "BETA2SER", "GAMS", "MSPIKE", "SPEI", "LDH", "CEA1", "CEA", "PSA1", "IGASERUM", "IGGSERUM", "IGMSERUM", "THGAB", "THYROGLB"  Review Flowsheet        Latest Ref Rng & Units 03/04/2022  Oncology Labs  Ferritin 15 - 150 ng/mL 110   %SAT 15 - 55 % 22     STUDIES:  No results found.   ASSESSMENT & PLAN:  A 47 y.o. female who I was asked to consult upon for *** .The patient understands all the plans discussed today and is in agreement with them.  I do appreciate No ref. provider found for his new consult.   Juaquin Ludington Kirby Funk, MD

## 2023-02-14 ENCOUNTER — Encounter: Payer: Self-pay | Admitting: Nurse Practitioner

## 2023-02-15 ENCOUNTER — Other Ambulatory Visit: Payer: Self-pay | Admitting: Nurse Practitioner

## 2023-02-15 DIAGNOSIS — B3731 Acute candidiasis of vulva and vagina: Secondary | ICD-10-CM

## 2023-02-15 MED ORDER — FLUCONAZOLE 150 MG PO TABS
150.0000 mg | ORAL_TABLET | ORAL | 0 refills | Status: DC
Start: 2023-02-15 — End: 2023-03-25

## 2023-02-16 LAB — SURGICAL PATHOLOGY

## 2023-03-04 LAB — FLOW CYTOMETRY

## 2023-03-14 ENCOUNTER — Inpatient Hospital Stay (INDEPENDENT_AMBULATORY_CARE_PROVIDER_SITE_OTHER): Payer: 59 | Admitting: Oncology

## 2023-03-14 ENCOUNTER — Inpatient Hospital Stay: Payer: 59 | Attending: Oncology

## 2023-03-14 VITALS — BP 140/89 | HR 95 | Temp 99.4°F | Resp 16 | Ht 63.0 in | Wt 189.8 lb

## 2023-03-14 DIAGNOSIS — D72820 Lymphocytosis (symptomatic): Secondary | ICD-10-CM

## 2023-03-14 LAB — CBC WITH DIFFERENTIAL (CANCER CENTER ONLY)
Abs Immature Granulocytes: 0.03 10*3/uL (ref 0.00–0.07)
Basophils Absolute: 0.1 10*3/uL (ref 0.0–0.1)
Basophils Relative: 1 %
Eosinophils Absolute: 0.3 10*3/uL (ref 0.0–0.5)
Eosinophils Relative: 2 %
HCT: 40.9 % (ref 36.0–46.0)
Hemoglobin: 13.1 g/dL (ref 12.0–15.0)
Immature Granulocytes: 0 %
Lymphocytes Relative: 40 %
Lymphs Abs: 4.8 10*3/uL — ABNORMAL HIGH (ref 0.7–4.0)
MCH: 28.4 pg (ref 26.0–34.0)
MCHC: 32 g/dL (ref 30.0–36.0)
MCV: 88.5 fL (ref 80.0–100.0)
Monocytes Absolute: 0.6 10*3/uL (ref 0.1–1.0)
Monocytes Relative: 5 %
Neutro Abs: 6.3 10*3/uL (ref 1.7–7.7)
Neutrophils Relative %: 52 %
Platelet Count: 397 10*3/uL (ref 150–400)
RBC: 4.62 MIL/uL (ref 3.87–5.11)
RDW: 12.8 % (ref 11.5–15.5)
WBC Count: 11.9 10*3/uL — ABNORMAL HIGH (ref 4.0–10.5)
nRBC: 0 % (ref 0.0–0.2)

## 2023-03-14 NOTE — Progress Notes (Signed)
[facility]  [address] [place],  [place]  [id number] [phone]  Clinic Day:  03/14/2023  Referring physician: Blane Ohara, MD   HISTORY OF PRESENT ILLNESS:  The patient is a 47 y.o. female who I recently began seeing for relative lymphocytosis.  However, at her initial visit, her white count differential was completely normal.  Only her total lymphocyte count was mildly elevated, but not higher than her total neutrophil count.  She comes in today to go over the results of her flow cytometry to ensure an underlying lymphoproliferative disorder is not present.  Since her last visit, the patient has been doing well.  She continues to deny having any B symptoms, lymphadenopathy, or other findings which concern her for a lymphoproliferative disorder potentially being present.  PHYSICAL EXAM:  Blood pressure (!) 140/89, pulse 95, temperature 99.4 F (37.4 C), resp. rate 16, height 5\' 3"  (1.6 m), weight 189 lb 12.8 oz (86.1 kg), SpO2 97 %, unknown if currently breastfeeding. Wt Readings from Last 3 Encounters:  03/14/23 189 lb 12.8 oz (86.1 kg)  02/11/23 188 lb 8 oz (85.5 kg)  12/13/22 178 lb (80.7 kg)   Body mass index is 33.62 kg/m. Performance status (ECOG): 0 - Asymptomatic Physical Exam Constitutional:      Appearance: Normal appearance. She is not ill-appearing.  HENT:     Mouth/Throat:     Mouth: Mucous membranes are moist.     Pharynx: Oropharynx is clear. No oropharyngeal exudate or posterior oropharyngeal erythema.  Cardiovascular:     Rate and Rhythm: Normal rate and regular rhythm.     Heart sounds: No murmur heard.    No friction rub. No gallop.  Pulmonary:     Effort: Pulmonary effort is normal. No respiratory distress.     Breath sounds: Normal breath sounds. No wheezing, rhonchi or rales.  Abdominal:     General: Bowel sounds are normal. There is no distension.     Palpations: Abdomen is soft. There is no mass.     Tenderness:  There is no abdominal tenderness.  Musculoskeletal:        General: No swelling.     Right lower leg: No edema.     Left lower leg: No edema.  Lymphadenopathy:     Cervical: No cervical adenopathy.     Upper Body:     Right upper body: No supraclavicular or axillary adenopathy.     Left upper body: No supraclavicular or axillary adenopathy.     Lower Body: No right inguinal adenopathy. No left inguinal adenopathy.  Skin:    General: Skin is warm.     Coloration: Skin is not jaundiced.     Findings: No lesion or rash.  Neurological:     General: No focal deficit present.     Mental Status: She is alert and oriented to person, place, and time. Mental status is at baseline.  Psychiatric:        Mood and Affect: Mood normal.        Behavior: Behavior normal.        Thought Content: Thought content normal.    LABS:    CBC PENDING      Latest Ref Rng & Units 02/11/2023   12:00 AM 01/10/2023   11:07 AM 12/13/2022    9:46 AM  CBC  WBC  13.1     10.5  10.6   Hemoglobin 12.0 - 16.0 12.1     11.5  13.0  Hematocrit 36 - 46 37     36.0  39.1   Platelets 150 - 400 K/uL 340     330  360      This result is from an external source.      Latest Ref Rng & Units 12/13/2022    9:46 AM 03/04/2022    9:12 AM 10/13/2021    8:03 AM  CMP  Glucose 65 - 99 mg/dL 88  84  78   BUN 7 - 25 mg/dL 12  10  8    Creatinine 0.50 - 0.99 mg/dL 1.61  0.96  0.45   Sodium 135 - 146 mmol/L 138  140  140   Potassium 3.5 - 5.3 mmol/L 4.1  4.2  4.5   Chloride 98 - 110 mmol/L 105  103  106   CO2 20 - 32 mmol/L 26  22  22    Calcium 8.6 - 10.2 mg/dL 9.1  9.0  9.0   Total Protein 6.1 - 8.1 g/dL 7.0  7.2  6.8   Total Bilirubin 0.2 - 1.2 mg/dL 0.5  0.4  0.3   Alkaline Phos 44 - 121 IU/L  79  73   AST 10 - 35 U/L 11  12  10    ALT 6 - 29 U/L 9  10  6      STUDIES:  Flow Cytometry of her peripheral blood revealed the following  DIAGNOSIS:   -No monoclonal B-cell population or abnormal T-cell phenotype identified     GATING AND PHENOTYPIC ANALYSIS:   Gated population: Flow cytometric immunophenotyping is performed using  antibodies to the antigens listed in the table below. Electronic gates  are placed around a cell cluster displaying light scatter properties  corresponding to: lymphocytes   Abnormal Cells in gated population: N/A   Phenotype of Abnormal Cells: N/A                      Lymphoid Antigens       Myeloid Antigens  Miscellaneous  CD2 tested    CD10 tested    CD11b     ND   CD45 tested  CD3  tested    CD19 tested    CD11c     ND   HLA-Dr    ND  CD4  tested    CD20 tested    CD13 ND   CD34 tested  CD5  tested    CD22 ND   CD14 ND   CD38 tested  CD7  tested    CD79b     ND   CD15 ND   CD138     ND  CD8  tested    CD103     ND   CD16 ND   TdT  ND  CD25 ND   CD200     tested    CD33 ND   CD123     ND  TCRab     ND   sKappa    tested    CD64 ND   CD41 ND  TCRgd     tested    sLambda   tested    CD117     ND   CD61 ND  CD56 tested    cKappa    ND   MPO  ND   CD71 ND  CD57 ND   cLambda   ND        [id number]   ASSESSMENT & PLAN:  Assessment/Plan:  A 47 y.o. female whose mild,  relative lymphocytosis per her initial labs does not appear to be associated with an underlying lymphoproliferative disorder.  In clinic today, I went over her flow cytometry results with her, for which she could see no underlying hematologic malignancy is present.  I further reiterated to her that I never thought such a disorder was present, especially as her white count differential has always been completely normal.  Overall, there is nothing per her history, labs, or physical exam to suggest any type of ominous hematologic disorder is taking place.  I do feel comfortable turning her care back over to her primary care office, with the recommendation that her CBC be checked at least 1-2 times per year.  I would not have a problem seeing her in the future if new hematologic issues arise that require repeat clinical  assessment.  The patient understands all the plans discussed today and is in agreement with them.    Letecia Arps Kirby Funk, MD

## 2023-03-15 DIAGNOSIS — D72820 Lymphocytosis (symptomatic): Secondary | ICD-10-CM | POA: Insufficient documentation

## 2023-03-25 ENCOUNTER — Ambulatory Visit (INDEPENDENT_AMBULATORY_CARE_PROVIDER_SITE_OTHER): Payer: 59 | Admitting: Physician Assistant

## 2023-03-25 ENCOUNTER — Encounter: Payer: Self-pay | Admitting: Physician Assistant

## 2023-03-25 VITALS — BP 118/82 | HR 76 | Temp 97.8°F | Ht 63.0 in | Wt 189.0 lb

## 2023-03-25 DIAGNOSIS — N3001 Acute cystitis with hematuria: Secondary | ICD-10-CM

## 2023-03-25 LAB — POCT URINALYSIS DIP (CLINITEK)
Bilirubin, UA: NEGATIVE
Glucose, UA: NEGATIVE mg/dL
Ketones, POC UA: NEGATIVE mg/dL
Leukocytes, UA: NEGATIVE
Nitrite, UA: POSITIVE — AB
POC PROTEIN,UA: NEGATIVE
Spec Grav, UA: 1.015 (ref 1.010–1.025)
Urobilinogen, UA: NEGATIVE E.U./dL — AB
pH, UA: 6 (ref 5.0–8.0)

## 2023-03-25 MED ORDER — FLUCONAZOLE 150 MG PO TABS
150.0000 mg | ORAL_TABLET | Freq: Every day | ORAL | 1 refills | Status: DC
Start: 2023-03-25 — End: 2023-04-05

## 2023-03-25 MED ORDER — SULFAMETHOXAZOLE-TRIMETHOPRIM 800-160 MG PO TABS
1.0000 | ORAL_TABLET | Freq: Two times a day (BID) | ORAL | 0 refills | Status: DC
Start: 2023-03-25 — End: 2023-07-08

## 2023-03-25 NOTE — Progress Notes (Unsigned)
Acute Office Visit  Subjective:    Patient ID: [name], female    DOB: 1976/05/26, 47 y.o.   MRN: [id number]  Chief Complaint  Patient presents with   Urinary Tract Infection    Urinary Tract Infection  Associated symptoms include chills, flank pain and frequency. Pertinent negatives include no hematuria, nausea or vomiting.   Patient is in today for UTI symptoms. Patient states she has been having urinary frequency for the past 3-4 days along with low back pain. She also states she has had headache fever and chills.   Past Medical History:  Diagnosis Date   AMA (advanced maternal age) multigravida 35+    Medical history non-contributory     Past Surgical History:  Procedure Laterality Date   CESAREAN SECTION  2016   NO PAST SURGERIES      Family History  Problem Relation Age of Onset   Diabetes Mother    High blood pressure Mother    Pulmonary fibrosis Mother    Diabetes Father    High blood pressure Father    High blood pressure Sister    Iron deficiency Sister    Diabetes Sister    Leukemia Paternal Uncle    Diabetes Paternal Grandmother    Diabetes Paternal Grandfather    Breast cancer Neg Hx     Social History   Socioeconomic History   Marital status: Divorced    Spouse name: Not on file   Number of children: 1   Years of education: 12 + 4   Highest education level: Bachelor's degree (e.g., BA, AB, BS)  Occupational History   Occupation: Engineer, site  Tobacco Use   Smoking status: Never   Smokeless tobacco: Never  Vaping Use   Vaping status: Never Used  Substance and Sexual Activity   Alcohol use: Yes    Alcohol/week: 2.0 standard drinks of alcohol    Types: 2 Cans of beer per week    Comment: BEER - OCCAS.WEEKENDS   Drug use: Never   Sexual activity: Yes    Birth control/protection: Patch    Comment: 1ST INTERCOURSE- 12, PARTNERS- 7  Other Topics Concern   Not on file  Social History Narrative   Not on file   Social  Determinants of Health   Financial Resource Strain: Not on file  Food Insecurity: No Food Insecurity (02/11/2023)   Hunger Vital Sign    Worried About Running Out of Food in the Last Year: Never true    Ran Out of Food in the Last Year: Never true  Transportation Needs: No Transportation Needs (02/11/2023)   PRAPARE - Administrator, Civil Service (Medical): No    Lack of Transportation (Non-Medical): No  Physical Activity: Not on file  Stress: Not on file  Social Connections: Not on file  Intimate Partner Violence: Not At Risk (02/11/2023)   Humiliation, Afraid, Rape, and Kick questionnaire    Fear of Current or Ex-Partner: No    Emotionally Abused: No    Physically Abused: No    Sexually Abused: No    Outpatient Medications Prior to Visit  Medication Sig Dispense Refill   ibuprofen (ADVIL) 600 MG tablet Take 1 tablet (600 mg total) by mouth every 8 (eight) hours as needed. 90 tablet 1   Multiple Vitamin (MULTIVITAMIN) tablet Take 1 tablet by mouth daily.     norelgestromin-ethinyl estradiol [employer]) 150-35 MCG/24HR transdermal patch Place 1 patch onto the skin once a week. 9 patch 4  fluconazole (DIFLUCAN) 150 MG tablet Take 1 tablet (150 mg total) by mouth every 3 (three) days. 2 tablet 0   nitrofurantoin, macrocrystal-monohydrate, (MACROBID) 100 MG capsule Take 1 capsule (100 mg total) by mouth 2 (two) times daily. 14 capsule 0   Vitamin D, Ergocalciferol, (DRISDOL) 1.25 MG (50000 UNIT) CAPS capsule TAKE 1 CAPSULE (50,000 UNITS TOTAL) BY MOUTH EVERY 7 (SEVEN) DAYS 12 capsule 3   No facility-administered medications prior to visit.    No Known Allergies  Review of Systems  Constitutional:  Positive for chills and fever. Negative for appetite change and fatigue.  HENT:  Negative for congestion, ear pain, sinus pressure and sore throat.   Respiratory:  Negative for cough, chest tightness, shortness of breath and wheezing.   Cardiovascular:  Negative for chest pain and  palpitations.  Gastrointestinal:  Negative for abdominal pain, constipation, diarrhea, nausea and vomiting.  Genitourinary:  Positive for flank pain and frequency. Negative for dysuria and hematuria.  Musculoskeletal:  Negative for arthralgias, back pain, joint swelling and myalgias.  Skin:  Negative for rash.  Neurological:  Positive for headaches. Negative for dizziness and weakness.  Psychiatric/Behavioral:  Negative for dysphoric mood. The patient is not nervous/anxious.        Objective:    Physical Exam Vitals reviewed.  Constitutional:      Appearance: Normal appearance. She is normal weight.  Cardiovascular:     Rate and Rhythm: Normal rate and regular rhythm.     Heart sounds: Normal heart sounds.  Pulmonary:     Effort: Pulmonary effort is normal.     Breath sounds: Normal breath sounds.  Abdominal:     General: Abdomen is flat. Bowel sounds are normal.     Palpations: Abdomen is soft.  Neurological:     Mental Status: She is alert and oriented to person, place, and time.  Psychiatric:        Mood and Affect: Mood normal.        Behavior: Behavior normal.     BP 118/82 (BP Location: Left Arm, Patient Position: Sitting)   Pulse 76   Temp 97.8 F (36.6 C) (Temporal)   Ht 5\' 3"  (1.6 m)   Wt 189 lb (85.7 kg)   SpO2 97%   BMI 33.48 kg/m  Wt Readings from Last 3 Encounters:  03/25/23 189 lb (85.7 kg)  03/14/23 189 lb 12.8 oz (86.1 kg)  02/11/23 188 lb 8 oz (85.5 kg)    Health Maintenance Due  Topic Date Due   Hepatitis C Screening  Never done   Colonoscopy  Never done   PAP SMEAR-Modifier  09/03/2022    There are no preventive care reminders to display for this patient.   Lab Results  Component Value Date   TSH 2.550 10/13/2021   Lab Results  Component Value Date   WBC 11.9 (H) 03/14/2023   HGB 13.1 03/14/2023   HCT 40.9 03/14/2023   MCV 88.5 03/14/2023   PLT 397 03/14/2023   Lab Results  Component Value Date   NA 138 12/13/2022   K 4.1  12/13/2022   CO2 26 12/13/2022   GLUCOSE 88 12/13/2022   BUN 12 12/13/2022   CREATININE 0.74 12/13/2022   BILITOT 0.5 12/13/2022   ALKPHOS 79 03/04/2022   AST 11 12/13/2022   ALT 9 12/13/2022   PROT 7.0 12/13/2022   ALBUMIN 4.2 03/04/2022   CALCIUM 9.1 12/13/2022   EGFR 87 03/04/2022   Lab Results  Component Value Date  CHOL 164 12/13/2022   Lab Results  Component Value Date   HDL 64 12/13/2022   Lab Results  Component Value Date   LDLCALC 80 12/13/2022   Lab Results  Component Value Date   TRIG 107 12/13/2022   Lab Results  Component Value Date   CHOLHDL 2.6 12/13/2022   Lab Results  Component Value Date   HGBA1C 5.5 12/13/2022         Assessment & Plan:   1. Acute cystitis with hematuria *** - POCT URINALYSIS DIP (CLINITEK) - sulfamethoxazole-trimethoprim (BACTRIM DS) 800-160 MG tablet; Take 1 tablet by mouth 2 (two) times daily.  Dispense: 20 tablet; Refill: 0 - Urine Culture    Meds ordered this encounter  Medications   sulfamethoxazole-trimethoprim (BACTRIM DS) 800-160 MG tablet    Sig: Take 1 tablet by mouth 2 (two) times daily.    Dispense:  20 tablet    Refill:  0      I,Lauren M Auman,acting as a scribe for [employer], PA.,have documented all relevant documentation on the behalf of Langley Gauss, PA,as directed by  Langley Gauss, PA while in the presence of Langley Gauss, [place].

## 2023-03-29 LAB — URINE CULTURE

## 2023-04-05 ENCOUNTER — Other Ambulatory Visit: Payer: Self-pay | Admitting: Physician Assistant

## 2023-04-05 DIAGNOSIS — N3001 Acute cystitis with hematuria: Secondary | ICD-10-CM

## 2023-04-05 MED ORDER — FLUCONAZOLE 150 MG PO TABS: 150.0000 mg | ORAL_TABLET | Freq: Every day | ORAL | 1 refills | Status: DC

## 2023-04-22 ENCOUNTER — Other Ambulatory Visit: Payer: Self-pay

## 2023-04-22 ENCOUNTER — Other Ambulatory Visit (HOSPITAL_COMMUNITY): Payer: Self-pay

## 2023-04-22 ENCOUNTER — Encounter: Payer: Self-pay | Admitting: Nurse Practitioner

## 2023-04-22 DIAGNOSIS — Z3045 Encounter for surveillance of transdermal patch hormonal contraceptive device: Secondary | ICD-10-CM

## 2023-04-22 MED ORDER — NORELGESTROMIN-ETH ESTRADIOL 150-35 MCG/24HR TD PTWK
1.0000 | MEDICATED_PATCH | TRANSDERMAL | 4 refills | Status: DC
Start: 2023-04-22 — End: 2023-10-12
  Filled 2023-04-22 – 2023-09-06 (×4): qty 9, 84d supply, fill #0

## 2023-04-22 NOTE — Telephone Encounter (Signed)
Prescription was sent by JC to [facility]

## 2023-04-22 NOTE — Telephone Encounter (Signed)
Pt requested BC through patient advice to be sent to [facility]

## 2023-04-29 ENCOUNTER — Other Ambulatory Visit (HOSPITAL_COMMUNITY): Payer: Self-pay

## 2023-04-29 ENCOUNTER — Other Ambulatory Visit: Payer: Self-pay

## 2023-04-30 ENCOUNTER — Other Ambulatory Visit (HOSPITAL_COMMUNITY): Payer: Self-pay

## 2023-06-01 ENCOUNTER — Other Ambulatory Visit (HOSPITAL_COMMUNITY): Payer: Self-pay

## 2023-06-02 ENCOUNTER — Ambulatory Visit (INDEPENDENT_AMBULATORY_CARE_PROVIDER_SITE_OTHER): Payer: 59

## 2023-06-02 DIAGNOSIS — Z23 Encounter for immunization: Secondary | ICD-10-CM

## 2023-07-04 ENCOUNTER — Other Ambulatory Visit: Payer: Self-pay | Admitting: Physician Assistant

## 2023-07-04 ENCOUNTER — Other Ambulatory Visit: Payer: 59

## 2023-07-04 DIAGNOSIS — Z111 Encounter for screening for respiratory tuberculosis: Secondary | ICD-10-CM

## 2023-07-04 DIAGNOSIS — E559 Vitamin D deficiency, unspecified: Secondary | ICD-10-CM

## 2023-07-04 DIAGNOSIS — E66811 Obesity, class 1: Secondary | ICD-10-CM | POA: Diagnosis not present

## 2023-07-04 DIAGNOSIS — Z87898 Personal history of other specified conditions: Secondary | ICD-10-CM

## 2023-07-04 DIAGNOSIS — R7309 Other abnormal glucose: Secondary | ICD-10-CM | POA: Diagnosis not present

## 2023-07-06 LAB — CBC WITH DIFFERENTIAL/PLATELET
Basophils Absolute: 0.1 10*3/uL (ref 0.0–0.2)
Basos: 1 %
EOS (ABSOLUTE): 0.3 10*3/uL (ref 0.0–0.4)
Eos: 3 %
Hematocrit: 39.6 % (ref 34.0–46.6)
Hemoglobin: 12.9 g/dL (ref 11.1–15.9)
Immature Grans (Abs): 0 10*3/uL (ref 0.0–0.1)
Immature Granulocytes: 0 %
Lymphocytes Absolute: 4.1 10*3/uL — ABNORMAL HIGH (ref 0.7–3.1)
Lymphs: 41 %
MCH: 28.5 pg (ref 26.6–33.0)
MCHC: 32.6 g/dL (ref 31.5–35.7)
MCV: 88 fL (ref 79–97)
Monocytes Absolute: 0.5 10*3/uL (ref 0.1–0.9)
Monocytes: 5 %
Neutrophils Absolute: 5 10*3/uL (ref 1.4–7.0)
Neutrophils: 50 %
Platelets: 365 10*3/uL (ref 150–450)
RBC: 4.52 x10E6/uL (ref 3.77–5.28)
RDW: 12.2 % (ref 11.7–15.4)
WBC: 10 10*3/uL (ref 3.4–10.8)

## 2023-07-06 LAB — COMPREHENSIVE METABOLIC PANEL
ALT: 11 [IU]/L (ref 0–32)
AST: 15 [IU]/L (ref 0–40)
Albumin: 4.4 g/dL (ref 3.9–4.9)
Alkaline Phosphatase: 97 [IU]/L (ref 44–121)
BUN/Creatinine Ratio: 11 (ref 9–23)
BUN: 10 mg/dL (ref 6–24)
Bilirubin Total: 0.4 mg/dL (ref 0.0–1.2)
CO2: 21 mmol/L (ref 20–29)
Calcium: 9.6 mg/dL (ref 8.7–10.2)
Chloride: 105 mmol/L (ref 96–106)
Creatinine, Ser: 0.87 mg/dL (ref 0.57–1.00)
Globulin, Total: 2.8 g/dL (ref 1.5–4.5)
Glucose: 100 mg/dL — ABNORMAL HIGH (ref 70–99)
Potassium: 4.3 mmol/L (ref 3.5–5.2)
Sodium: 141 mmol/L (ref 134–144)
Total Protein: 7.2 g/dL (ref 6.0–8.5)
eGFR: 83 mL/min/{1.73_m2} (ref >59)

## 2023-07-06 LAB — QUANTIFERON-TB GOLD PLUS
QuantiFERON Mitogen Value: >10 [IU]/mL
QuantiFERON Nil Value: 0.01 [IU]/mL
QuantiFERON TB1 Ag Value: 9.7 [IU]/mL
QuantiFERON TB2 Ag Value: 8.97 [IU]/mL
QuantiFERON-TB Gold Plus: POSITIVE — AB

## 2023-07-06 LAB — LIPID PANEL
Chol/HDL Ratio: 3.2 ratio (ref 0.0–4.4)
Cholesterol, Total: 176 mg/dL (ref 100–199)
HDL: 55 mg/dL (ref >39)
LDL Chol Calc (NIH): 98 mg/dL (ref 0–99)
Triglycerides: 132 mg/dL (ref 0–149)
VLDL Cholesterol Cal: 23 mg/dL (ref 5–40)

## 2023-07-06 LAB — VITAMIN D 25 HYDROXY (VIT D DEFICIENCY, FRACTURES): Vit D, 25-Hydroxy: 43.7 ng/mL (ref 30.0–100.0)

## 2023-07-08 ENCOUNTER — Ambulatory Visit (INDEPENDENT_AMBULATORY_CARE_PROVIDER_SITE_OTHER): Payer: 59 | Admitting: Physician Assistant

## 2023-07-08 ENCOUNTER — Ambulatory Visit (HOSPITAL_BASED_OUTPATIENT_CLINIC_OR_DEPARTMENT_OTHER)
Admission: RE | Admit: 2023-07-08 | Discharge: 2023-07-08 | Disposition: A | Discharge status: Home / Self Care | Payer: 59 | Source: Ambulatory Visit | Attending: Physician Assistant | Admitting: Physician Assistant

## 2023-07-08 ENCOUNTER — Encounter: Payer: Self-pay | Admitting: Physician Assistant

## 2023-07-08 VITALS — BP 134/88 | HR 108 | Temp 97.3°F | Ht 63.0 in | Wt 189.0 lb

## 2023-07-08 DIAGNOSIS — Z111 Encounter for screening for respiratory tuberculosis: Secondary | ICD-10-CM | POA: Diagnosis not present

## 2023-07-08 DIAGNOSIS — R300 Dysuria: Secondary | ICD-10-CM | POA: Diagnosis not present

## 2023-07-08 DIAGNOSIS — R109 Unspecified abdominal pain: Secondary | ICD-10-CM | POA: Diagnosis not present

## 2023-07-08 DIAGNOSIS — N3000 Acute cystitis without hematuria: Secondary | ICD-10-CM | POA: Diagnosis not present

## 2023-07-08 LAB — POCT URINALYSIS DIP (CLINITEK)
Bilirubin, UA: NEGATIVE
Blood, UA: NEGATIVE
Glucose, UA: NEGATIVE mg/dL
Ketones, POC UA: NEGATIVE mg/dL
Nitrite, UA: NEGATIVE
POC PROTEIN,UA: NEGATIVE
Spec Grav, UA: 1.01 (ref 1.010–1.025)
Urobilinogen, UA: NEGATIVE U/dL — AB
pH, UA: 6 (ref 5.0–8.0)

## 2023-07-08 LAB — HEMOGLOBIN A1C
Est. average glucose Bld gHb Est-mCnc: 120 mg/dL
Hgb A1c MFr Bld: 5.8 % — ABNORMAL HIGH (ref 4.8–5.6)

## 2023-07-08 LAB — SPECIMEN STATUS REPORT

## 2023-07-08 NOTE — Progress Notes (Unsigned)
Acute Office Visit  Subjective:    Patient ID: [name], female    DOB: [birth year], 47 y.o.   MRN: [id number]  Chief Complaint  Patient presents with   Dont feel good    HPI: Patient is in today for Sore throat, and needs chest x-ray to rule out TB. States that she has flank pain that will come and go over the past few months. States that she will have intermittent dysuria with it as well. She has had a few this year so we discussed doing and abdominal ultrasound and if they found anything we can then look into a CT or if they are not able to see anything and she is still having pain we can try a CT.    Past Medical History:  Diagnosis Date   AMA (advanced maternal age) multigravida 35+    Medical history non-contributory     Past Surgical History:  Procedure Laterality Date   CESAREAN SECTION  2016   NO PAST SURGERIES      Family History  Problem Relation Age of Onset   Diabetes Mother    High blood pressure Mother    Pulmonary fibrosis Mother    Diabetes Father    High blood pressure Father    High blood pressure Sister    Iron deficiency Sister    Diabetes Sister    Leukemia Paternal Uncle    Diabetes Paternal Grandmother    Diabetes Paternal Grandfather    Breast cancer Neg Hx     Social History   Socioeconomic History   Marital status: Divorced    Spouse name: Not on file   Number of children: 1   Years of education: 12 + 4   Highest education level: Bachelor's degree (e.g., BA, AB, BS)  Occupational History   Occupation: Engineer, site  Tobacco Use   Smoking status: Never   Smokeless tobacco: Never  Vaping Use   Vaping status: Never Used  Substance and Sexual Activity   Alcohol use: Yes    Alcohol/week: 2.0 standard drinks of alcohol    Types: 2 Cans of beer per week    Comment: BEER - OCCAS.WEEKENDS   Drug use: Never   Sexual activity: Yes    Birth control/protection: Patch    Comment: 1ST INTERCOURSE- 31, PARTNERS- 7  Other  Topics Concern   Not on file  Social History Narrative   Not on file   Social Determinants of Health   Financial Resource Strain: Not on file  Food Insecurity: No Food Insecurity (02/11/2023)   Hunger Vital Sign    Worried About Running Out of Food in the Last Year: Never true    Ran Out of Food in the Last Year: Never true  Transportation Needs: No Transportation Needs (02/11/2023)   PRAPARE - Administrator, Civil Service (Medical): No    Lack of Transportation (Non-Medical): No  Physical Activity: Not on file  Stress: Not on file  Social Connections: Not on file  Intimate Partner Violence: Not At Risk (02/11/2023)   Humiliation, Afraid, Rape, and Kick questionnaire    Fear of Current or Ex-Partner: No    Emotionally Abused: No    Physically Abused: No    Sexually Abused: No    Outpatient Medications Prior to Visit  Medication Sig Dispense Refill   ibuprofen (ADVIL) 600 MG tablet Take 1 tablet (600 mg total) by mouth every 8 (eight) hours as needed. 90 tablet 1   Multiple  Vitamin (MULTIVITAMIN) tablet Take 1 tablet by mouth daily.     norelgestromin-ethinyl estradiol [employer] [facility]ico) 150-35 MCG/24HR transdermal patch Place 1 patch onto the skin once a week. 9 patch 4   fluconazole (DIFLUCAN) 150 MG tablet Take 1 tablet (150 mg total) by mouth daily. 1 tablet 1   sulfamethoxazole-trimethoprim (BACTRIM DS) 800-160 MG tablet Take 1 tablet by mouth 2 (two) times daily. 20 tablet 0   No facility-administered medications prior to visit.    No Known Allergies  Review of Systems  Constitutional:  Negative for appetite change, fatigue and fever.  HENT:  Positive for sore throat. Negative for congestion, ear pain and sinus pressure.   Respiratory:  Negative for cough, chest tightness, shortness of breath and wheezing.   Cardiovascular:  Negative for chest pain and palpitations.  Gastrointestinal:  Negative for abdominal pain, constipation, diarrhea, nausea and vomiting.   Genitourinary:  Negative for dysuria and hematuria.  Musculoskeletal:  Negative for arthralgias, back pain, joint swelling and myalgias.  Skin:  Negative for rash.  Neurological:  Negative for dizziness, weakness and headaches.  Psychiatric/Behavioral:  Negative for dysphoric mood. The patient is not nervous/anxious.        Objective:        07/08/2023   11:49 AM 03/25/2023   11:19 AM 03/14/2023    3:10 PM  Vitals with BMI  Height 5\' 3"  5\' 3"  5\' 3"   Weight 189 lbs 189 lbs 189 lbs 13 oz  BMI 33.49 33.49 33.63  Systolic 134 118 308  Diastolic 88 82 89  Pulse 108 76 95    No data found.    Physical Exam Vitals reviewed.  Constitutional:      Appearance: Normal appearance.  HENT:     Mouth/Throat:     Mouth: Mucous membranes are moist.     Tongue: Tongue does not deviate from midline.     Pharynx: Posterior oropharyngeal erythema and postnasal drip present.  Cardiovascular:     Rate and Rhythm: Normal rate and regular rhythm.     Heart sounds: Normal heart sounds.  Pulmonary:     Effort: Pulmonary effort is normal.     Breath sounds: Normal breath sounds.  Abdominal:     General: Bowel sounds are normal.     Palpations: Abdomen is soft.     Tenderness: There is no abdominal tenderness.  Neurological:     Mental Status: She is alert and oriented to person, place, and time.  Psychiatric:        Mood and Affect: Mood normal.        Behavior: Behavior normal.     Health Maintenance Due  Topic Date Due   Hepatitis C Screening  Never done   Colonoscopy  Never done    There are no preventive care reminders to display for this patient.   Lab Results  Component Value Date   TSH 2.550 10/13/2021   Lab Results  Component Value Date   WBC 10.0 07/04/2023   HGB 12.9 07/04/2023   HCT 39.6 07/04/2023   MCV 88 07/04/2023   PLT 365 07/04/2023   Lab Results  Component Value Date   NA 141 07/04/2023   K 4.3 07/04/2023   CO2 21 07/04/2023   GLUCOSE 100 (H)  07/04/2023   BUN 10 07/04/2023   CREATININE 0.87 07/04/2023   BILITOT 0.4 07/04/2023   ALKPHOS 97 07/04/2023   AST 15 07/04/2023   ALT 11 07/04/2023   PROT 7.2 07/04/2023   ALBUMIN  4.4 07/04/2023   CALCIUM 9.6 07/04/2023   EGFR 83 07/04/2023   Lab Results  Component Value Date   CHOL 176 07/04/2023   Lab Results  Component Value Date   HDL 55 07/04/2023   Lab Results  Component Value Date   LDLCALC 98 07/04/2023   Lab Results  Component Value Date   TRIG 132 07/04/2023   Lab Results  Component Value Date   CHOLHDL 3.2 07/04/2023   Lab Results  Component Value Date   HGBA1C 5.8 (H) 07/04/2023       Assessment & Plan:  Flank pain, acute Assessment & Plan: Sent for U/S of the abdomin Will possibly refer to urology  Orders: -     US Abdomen Complete; Future  Screening-pulmonary TB Assessment & Plan: Sent for chest x-ray Will send for treatment if needed  Orders: -     DG Chest 2 View; Future  Dysuria Assessment & Plan: Urinalysis came back positive Sent for culture Will send antibiotics based on culture.   Orders: -     POCT URINALYSIS DIP (CLINITEK) -     Urine Culture  Acute cystitis without hematuria -     US Abdomen Complete; Future     No orders of the defined types were placed in this encounter.   Orders Placed This Encounter  Procedures   Urine Culture   DG Chest 2 View   US Abdomen Complete   POCT URINALYSIS DIP (CLINITEK)     Follow-up: No follow-ups on file.  An After Visit Summary was printed and given to the patient.   I,Lauren M Auman,acting as a Neurosurgeon for [employer], PA.,have documented all relevant documentation on the behalf of Langley Gauss, PA,as directed by  Langley Gauss, PA while in the presence of Langley Gauss, [place].    Langley Gauss, [place] [facility] [phone]

## 2023-07-11 DIAGNOSIS — R109 Unspecified abdominal pain: Secondary | ICD-10-CM | POA: Insufficient documentation

## 2023-07-11 DIAGNOSIS — Z111 Encounter for screening for respiratory tuberculosis: Secondary | ICD-10-CM | POA: Insufficient documentation

## 2023-07-11 DIAGNOSIS — R300 Dysuria: Secondary | ICD-10-CM | POA: Insufficient documentation

## 2023-07-11 NOTE — Assessment & Plan Note (Signed)
Urinalysis came back positive Sent for culture Will send antibiotics based on culture.

## 2023-07-11 NOTE — Assessment & Plan Note (Signed)
Sent for chest x-ray Will send for treatment if needed

## 2023-07-11 NOTE — Assessment & Plan Note (Signed)
Sent for U/S of the abdomin Will possibly refer to urology

## 2023-07-12 ENCOUNTER — Other Ambulatory Visit: Payer: Self-pay | Admitting: Physician Assistant

## 2023-07-12 DIAGNOSIS — R300 Dysuria: Secondary | ICD-10-CM

## 2023-07-12 DIAGNOSIS — J0100 Acute maxillary sinusitis, unspecified: Secondary | ICD-10-CM

## 2023-07-12 LAB — URINE CULTURE

## 2023-07-12 MED ORDER — AMOXICILLIN-POT CLAVULANATE 875-125 MG PO TABS: 1.0000 | ORAL_TABLET | Freq: Two times a day (BID) | ORAL | 0 refills | Status: DC

## 2023-07-15 ENCOUNTER — Ambulatory Visit (HOSPITAL_BASED_OUTPATIENT_CLINIC_OR_DEPARTMENT_OTHER)
Admission: RE | Admit: 2023-07-15 | Discharge: 2023-07-15 | Disposition: A | Discharge status: Home / Self Care | Payer: 59 | Source: Ambulatory Visit | Attending: Physician Assistant | Admitting: Physician Assistant

## 2023-07-15 DIAGNOSIS — K8020 Calculus of gallbladder without cholecystitis without obstruction: Secondary | ICD-10-CM | POA: Diagnosis not present

## 2023-07-15 DIAGNOSIS — R109 Unspecified abdominal pain: Secondary | ICD-10-CM | POA: Diagnosis not present

## 2023-07-15 DIAGNOSIS — N3000 Acute cystitis without hematuria: Secondary | ICD-10-CM | POA: Diagnosis not present

## 2023-07-21 ENCOUNTER — Other Ambulatory Visit (HOSPITAL_BASED_OUTPATIENT_CLINIC_OR_DEPARTMENT_OTHER): Payer: Self-pay

## 2023-07-21 ENCOUNTER — Other Ambulatory Visit: Payer: Self-pay

## 2023-07-21 ENCOUNTER — Telehealth: Payer: Self-pay

## 2023-07-21 MED ORDER — FLUCONAZOLE 150 MG PO TABS
150.0000 mg | ORAL_TABLET | Freq: Every day | ORAL | 0 refills | Status: DC
  Filled 2023-07-21: qty 1, 1d supply, fill #0

## 2023-07-21 NOTE — Telephone Encounter (Addendum)
Patient requesting rx for diflucan to  be sent to [facility], due to her taking an antibiotic. Per Huston Foley ok to send.

## 2023-09-06 ENCOUNTER — Ambulatory Visit (INDEPENDENT_AMBULATORY_CARE_PROVIDER_SITE_OTHER): Payer: 59

## 2023-09-06 ENCOUNTER — Other Ambulatory Visit (HOSPITAL_BASED_OUTPATIENT_CLINIC_OR_DEPARTMENT_OTHER): Payer: Self-pay

## 2023-09-06 DIAGNOSIS — R300 Dysuria: Secondary | ICD-10-CM

## 2023-09-06 DIAGNOSIS — N3001 Acute cystitis with hematuria: Secondary | ICD-10-CM | POA: Diagnosis not present

## 2023-09-06 LAB — POCT URINALYSIS DIP (CLINITEK)
Bilirubin, UA: NEGATIVE
Glucose, UA: NEGATIVE mg/dL
Ketones, POC UA: NEGATIVE mg/dL
Nitrite, UA: NEGATIVE
POC PROTEIN,UA: NEGATIVE
Spec Grav, UA: <=1.005 — AB (ref 1.010–1.025)
Urobilinogen, UA: 0.2 U/dL
pH, UA: 6 (ref 5.0–8.0)

## 2023-09-06 MED ORDER — AMOXICILLIN-POT CLAVULANATE 875-125 MG PO TABS
1.0000 | ORAL_TABLET | Freq: Two times a day (BID) | ORAL | 0 refills | Status: DC
  Filled 2023-09-06: qty 10, 5d supply, fill #0

## 2023-09-06 NOTE — Progress Notes (Signed)
 Patient is in office today for a nurse visit for Repeat UA. Patient UA was  positive for WBCs and positive for RBCs, patient dysuria.  Nola Angles, PA-C: recommend he sent in Augmentin  875-125 mg twice a day for 5 days, and once culture comes back if antibiotic needs to be change office will call patient.

## 2023-09-09 LAB — URINE CULTURE

## 2023-10-12 ENCOUNTER — Ambulatory Visit: Payer: 59 | Admitting: Nurse Practitioner

## 2023-10-12 ENCOUNTER — Other Ambulatory Visit (HOSPITAL_BASED_OUTPATIENT_CLINIC_OR_DEPARTMENT_OTHER): Payer: Self-pay

## 2023-10-12 VITALS — BP 108/64 | HR 71 | Temp 98.2°F | Wt 193.0 lb

## 2023-10-12 DIAGNOSIS — N760 Acute vaginitis: Secondary | ICD-10-CM

## 2023-10-12 DIAGNOSIS — R300 Dysuria: Secondary | ICD-10-CM

## 2023-10-12 DIAGNOSIS — Z30011 Encounter for initial prescription of contraceptive pills: Secondary | ICD-10-CM | POA: Diagnosis not present

## 2023-10-12 DIAGNOSIS — N898 Other specified noninflammatory disorders of vagina: Secondary | ICD-10-CM

## 2023-10-12 DIAGNOSIS — N393 Stress incontinence (female) (male): Secondary | ICD-10-CM | POA: Diagnosis not present

## 2023-10-12 DIAGNOSIS — N939 Abnormal uterine and vaginal bleeding, unspecified: Secondary | ICD-10-CM

## 2023-10-12 DIAGNOSIS — N3000 Acute cystitis without hematuria: Secondary | ICD-10-CM

## 2023-10-12 LAB — PREGNANCY, URINE: Preg Test, Ur: NEGATIVE

## 2023-10-12 LAB — WET PREP FOR TRICH, YEAST, CLUE

## 2023-10-12 MED ORDER — NORETHIN ACE-ETH ESTRAD-FE 1-20 MG-MCG PO TABS
1.0000 | ORAL_TABLET | Freq: Every day | ORAL | 0 refills | Status: DC
  Filled 2023-10-12: qty 84, 84d supply, fill #0

## 2023-10-12 MED ORDER — FLUCONAZOLE 150 MG PO TABS
150.0000 mg | ORAL_TABLET | ORAL | 0 refills | Status: DC
  Filled 2023-10-12: qty 2, 6d supply, fill #0

## 2023-10-12 MED ORDER — NITROFURANTOIN MONOHYD MACRO 100 MG PO CAPS
100.0000 mg | ORAL_CAPSULE | Freq: Two times a day (BID) | ORAL | 0 refills | Status: DC
  Filled 2023-10-12: qty 14, 7d supply, fill #0

## 2023-10-12 NOTE — Progress Notes (Signed)
 Acute Office Visit  Subjective:    Patient ID: [name], female    DOB: 03/10/1976, 48 y.o.   MRN: [id number]   HPI 48 y.o. presents today for burning with urination x [redacted] week along with some cramping and back pain. Also reports vaginal itching at end of stream. Denies discharge or odor. Wants to discuss changing birth control. Currently using patch but it does not always stick. Will fall halfway off, at times then she will have spotting and cramping. Sexually active. Also has pain sometimes with intercourse, mostly with certain positions. Worried her bladder has prolapsed as she has also started having mild stress incontinence for about 6 months.   Patient's last menstrual period was 09/22/2023 (approximate).    Review of Systems  Constitutional: Negative.   Genitourinary:  Positive for dyspareunia, dysuria, urgency, vaginal bleeding and vaginal pain. Negative for flank pain, frequency, hematuria, pelvic pain and vaginal discharge.       Objective:    Physical Exam Constitutional:      Appearance: Normal appearance.  Genitourinary:    General: Normal vulva.     Vagina: Normal.     Cervix: Normal.     BP 108/64 (BP Location: Right Arm, Patient Position: Sitting, Cuff Size: Normal)   Pulse 71   Temp 98.2 F (36.8 C) (Oral)   Wt 193 lb (87.5 kg)   LMP 09/22/2023 (Approximate)   SpO2 98%   BMI 34.19 kg/m  Wt Readings from Last 3 Encounters:  10/12/23 193 lb (87.5 kg)  07/08/23 189 lb (85.7 kg)  03/25/23 189 lb (85.7 kg)        Patient informed chaperone available to be present for breast and/or pelvic exam. Patient has requested no chaperone to be present. Patient has been advised what will be completed during breast and pelvic exam.   Wet prep negative for pathogens  UPT negative  UA: 1+ leukocytes, negative nitrites, trace blood, yellow/slightly cloudy. Microscopic: wbc 10-20, rbc 0-2, few bacteria  Assessment & Plan:   Problem List Items Addressed  This Visit   None Visit Diagnoses       Acute cystitis without hematuria    -  Primary   Relevant Medications   nitrofurantoin , macrocrystal-monohydrate, (MACROBID ) 100 MG capsule     Burning with urination       Relevant Orders   Urinalysis,Complete w/RFL Culture     Encounter for initial prescription of contraceptive pills       Relevant Medications   norethindrone -ethinyl estradiol -FE (LOESTRIN  FE) 1-20 MG-MCG tablet     Vaginal spotting       Relevant Orders   Pregnancy, urine     Vaginal itching       Relevant Orders   WET PREP FOR TRICH, YEAST, CLUE     Acute vaginitis       Relevant Medications   fluconazole  (DIFLUCAN ) 150 MG tablet     Stress incontinence          Plan: Negative UPT and wet prep. UA shows small amount of leukocytes. Will treat empirically with Macrobid  100 mg BID x 7 days. Culture pending. Diflucan  requested due to history of yeast with antibiotic use.   No evidence of bladder prolapse. Patient reassured. Has had some weight gain and this could be contributing to stress incontinence. Plans to work on weight loss, lifestyle changes for management.   Contraceptive options were reviewed, including hormonal methods, both combination (pill, patch, vaginal ring) and progesterone-only (pill, Depo Provera  and  Nexplanon), intrauterine devices (Mirena, Kyleena, Skyla, and Paraguard), Phexxi, barrier methods (condoms, diaphragm) and female/female sterilization. The mechanisms, risks, benefits and side effects of all methods were discussed. Wants to switch to COCs. Will start with next menses.    Return if symptoms worsen or fail to improve.    Wendy DELENA Shutter DNP, 11:40 AM 10/12/2023

## 2023-10-14 LAB — URINALYSIS, COMPLETE W/RFL CULTURE
Bilirubin Urine: NEGATIVE
Glucose, UA: NEGATIVE
Hyaline Cast: NONE SEEN /[LPF]
Ketones, ur: NEGATIVE
Nitrites, Initial: NEGATIVE
Protein, ur: NEGATIVE
Specific Gravity, Urine: 1.003 (ref 1.001–1.035)
pH: 5.5 (ref 5.0–8.0)

## 2023-10-14 LAB — URINE CULTURE
MICRO NUMBER:: 16044819
SPECIMEN QUALITY:: ADEQUATE

## 2023-10-14 LAB — CULTURE INDICATED

## 2023-10-17 ENCOUNTER — Encounter: Payer: Self-pay | Admitting: Nurse Practitioner

## 2023-12-07 ENCOUNTER — Encounter: Payer: Self-pay | Admitting: Nurse Practitioner

## 2023-12-14 ENCOUNTER — Other Ambulatory Visit (HOSPITAL_COMMUNITY)
Admission: RE | Admit: 2023-12-14 | Discharge: 2023-12-14 | Disposition: A | Discharge status: Home / Self Care | Source: Ambulatory Visit | Attending: Nurse Practitioner | Admitting: Nurse Practitioner

## 2023-12-14 ENCOUNTER — Ambulatory Visit (INDEPENDENT_AMBULATORY_CARE_PROVIDER_SITE_OTHER): Payer: 59 | Admitting: Nurse Practitioner

## 2023-12-14 ENCOUNTER — Other Ambulatory Visit (HOSPITAL_BASED_OUTPATIENT_CLINIC_OR_DEPARTMENT_OTHER): Payer: Self-pay

## 2023-12-14 ENCOUNTER — Encounter: Payer: Self-pay | Admitting: Nurse Practitioner

## 2023-12-14 VITALS — BP 122/82 | HR 76 | Ht 63.0 in | Wt 195.0 lb

## 2023-12-14 DIAGNOSIS — Z3041 Encounter for surveillance of contraceptive pills: Secondary | ICD-10-CM

## 2023-12-14 DIAGNOSIS — Z01419 Encounter for gynecological examination (general) (routine) without abnormal findings: Secondary | ICD-10-CM

## 2023-12-14 DIAGNOSIS — R8290 Unspecified abnormal findings in urine: Secondary | ICD-10-CM

## 2023-12-14 DIAGNOSIS — Z1331 Encounter for screening for depression: Secondary | ICD-10-CM | POA: Diagnosis not present

## 2023-12-14 DIAGNOSIS — Z124 Encounter for screening for malignant neoplasm of cervix: Secondary | ICD-10-CM

## 2023-12-14 MED ORDER — NORETHIN ACE-ETH ESTRAD-FE 1-20 MG-MCG PO TABS
1.0000 | ORAL_TABLET | Freq: Every day | ORAL | 3 refills | Status: AC
  Filled 2023-12-14 – 2023-12-19 (×2): qty 84, 84d supply, fill #0
  Filled 2024-04-23: qty 84, 84d supply, fill #1
  Filled 2024-07-09: qty 84, 84d supply, fill #2
  Filled 2024-09-26: qty 84, 84d supply, fill #3

## 2023-12-14 NOTE — Progress Notes (Signed)
 [name] 1976/04/13 [id number]   History:  48 y.o. G1P1001 presents for annual exam. Amenorrheic on COCs. Normal pap and mammogram history. Treated for UTI in February and would like urine checked today. Has noticed some intermittent urine odor.   Gynecologic History No LMP recorded. (Menstrual status: Oral contraceptives).   Contraception: OCP (estrogen/progesterone) Sexually active: Yes  Health maintenance Last Pap: 09/04/2019. Results were: Normal neg HPV, 5-year repeat Last mammogram: 12/30/2021. Results were: Normal Last colonoscopy: Never. Negative Cologuard 12/12/2021 Last Dexa: Never  Flowsheet Row Office Visit from 12/14/2023 in [facility]  PHQ-2 Total Score 0        Past medical history, past surgical history, family history and social history were all reviewed and documented in the EPIC chart. 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 8 yo daughter, in 2nd grade at [employer]. Significant family history of diabetes and HTN. Mother and grandmother with osteoporosis.   ROS:  A ROS was performed and pertinent positives and negatives are included.  Exam:  Vitals:   12/14/23 0956  BP: 122/82  Pulse: 76  SpO2: 98%  Weight: 195 lb (88.5 kg)  Height: 5\' 3"  (1.6 m)      Body mass index is 34.54 kg/m.  General appearance:  Normal Thyroid:  Symmetrical, normal in size, without palpable masses or nodularity. Respiratory  Auscultation:  Clear without wheezing or rhonchi Cardiovascular  Auscultation:  Regular rate, without rubs, murmurs or gallops  Edema/varicosities:  Not grossly evident Abdominal  Soft,nontender, without masses, guarding or rebound.  Liver/spleen:  No organomegaly noted  Hernia:  None appreciated  Skin  Inspection:  Grossly normal   Breasts: Examined lying and sitting.   Right: Without masses, retractions, discharge or axillary  adenopathy.   Left: Without masses, retractions, discharge or axillary adenopathy. Pelvic: External genitalia:  no lesions              Urethra:  normal appearing urethra with no masses, tenderness or lesions              Bartholins and Skenes: normal                 Vagina: normal appearing vagina with normal color and discharge, no lesions              Cervix: no lesions Bimanual Exam:  Uterus:  no masses or tenderness              Adnexa: no mass, fullness, tenderness              Rectovaginal: Deferred              Anus:  normal, no lesions  Patient informed chaperone available to be present for breast and pelvic exam. Patient has requested no chaperone to be present. Patient has been advised what will be completed during breast and pelvic exam.  UA negative  Assessment/Plan:  48 y.o. G1P1001 for annual exam.   Well female exam with routine gynecological exam - Education provided on SBEs, importance of preventative screenings, current guidelines, high calcium diet, regular exercise, and multivitamin daily. Labs with PCP.   Cervical cancer screening - Plan: Cytology - PAP( [place]). Normal pap history.   Abnormal urine odor - Plan: Urinalysis,Complete w/RFL Culture. UA negative.   Encounter for surveillance of contraceptive pills - Plan: norethindrone-ethinyl estradiol-FE (LOESTRIN FE) 1-20 MG-MCG tablet daily.   Screening for colon  cancer - Negative Cologuard 12/2021. Will repeat at 3-year interval per recommendation.   Screening for breast cancer - Normal mammogram history. Overdue and encouraged to schedule. Normal breast exam today.   Return in about 1 year (around 12/13/2024) for Annual.     [name] [facility], 10:24 AM 12/14/2023

## 2023-12-15 LAB — URINALYSIS, COMPLETE W/RFL CULTURE
Bilirubin Urine: NEGATIVE
Glucose, UA: NEGATIVE
Hyaline Cast: NONE SEEN /LPF
Ketones, ur: NEGATIVE
Leukocyte Esterase: NEGATIVE
Nitrites, Initial: NEGATIVE
Protein, ur: NEGATIVE
Specific Gravity, Urine: 1.01 (ref 1.001–1.035)
pH: 5.5 (ref 5.0–8.0)

## 2023-12-15 LAB — URINE CULTURE
MICRO NUMBER:: 16308379
Result:: NO GROWTH
SPECIMEN QUALITY:: ADEQUATE

## 2023-12-15 LAB — CULTURE INDICATED

## 2023-12-19 ENCOUNTER — Other Ambulatory Visit (HOSPITAL_BASED_OUTPATIENT_CLINIC_OR_DEPARTMENT_OTHER): Payer: Self-pay

## 2023-12-19 ENCOUNTER — Encounter: Payer: Self-pay | Admitting: Nurse Practitioner

## 2023-12-20 ENCOUNTER — Encounter: Payer: Self-pay | Admitting: Nurse Practitioner

## 2023-12-20 ENCOUNTER — Other Ambulatory Visit (HOSPITAL_BASED_OUTPATIENT_CLINIC_OR_DEPARTMENT_OTHER): Payer: Self-pay

## 2023-12-20 ENCOUNTER — Other Ambulatory Visit: Payer: Self-pay | Admitting: Nurse Practitioner

## 2023-12-20 DIAGNOSIS — B3731 Acute candidiasis of vulva and vagina: Secondary | ICD-10-CM

## 2023-12-20 LAB — CYTOLOGY - PAP
Comment: NEGATIVE
Diagnosis: UNDETERMINED — AB
High risk HPV: POSITIVE — AB

## 2023-12-20 MED ORDER — FLUCONAZOLE 150 MG PO TABS
150.0000 mg | ORAL_TABLET | ORAL | 0 refills | Status: DC
  Filled 2023-12-20: qty 2, 6d supply, fill #0

## 2023-12-27 ENCOUNTER — Other Ambulatory Visit (HOSPITAL_BASED_OUTPATIENT_CLINIC_OR_DEPARTMENT_OTHER): Payer: Self-pay

## 2023-12-27 ENCOUNTER — Other Ambulatory Visit: Payer: Self-pay | Admitting: Family Medicine

## 2023-12-27 DIAGNOSIS — E66811 Obesity, class 1: Secondary | ICD-10-CM

## 2023-12-27 MED ORDER — PHENTERMINE HCL 37.5 MG PO CAPS
37.5000 mg | ORAL_CAPSULE | ORAL | 0 refills | Status: DC
  Filled 2023-12-27: qty 30, 30d supply, fill #0

## 2024-01-10 ENCOUNTER — Ambulatory Visit (INDEPENDENT_AMBULATORY_CARE_PROVIDER_SITE_OTHER): Admitting: Physician Assistant

## 2024-01-10 ENCOUNTER — Other Ambulatory Visit (HOSPITAL_BASED_OUTPATIENT_CLINIC_OR_DEPARTMENT_OTHER): Payer: Self-pay

## 2024-01-10 ENCOUNTER — Encounter: Payer: Self-pay | Admitting: Physician Assistant

## 2024-01-10 VITALS — BP 132/80 | Ht 63.0 in | Wt 195.0 lb

## 2024-01-10 DIAGNOSIS — E559 Vitamin D deficiency, unspecified: Secondary | ICD-10-CM

## 2024-01-10 DIAGNOSIS — N3001 Acute cystitis with hematuria: Secondary | ICD-10-CM | POA: Diagnosis not present

## 2024-01-10 DIAGNOSIS — Z1211 Encounter for screening for malignant neoplasm of colon: Secondary | ICD-10-CM | POA: Insufficient documentation

## 2024-01-10 DIAGNOSIS — Z1159 Encounter for screening for other viral diseases: Secondary | ICD-10-CM | POA: Diagnosis not present

## 2024-01-10 DIAGNOSIS — E66811 Obesity, class 1: Secondary | ICD-10-CM

## 2024-01-10 DIAGNOSIS — E538 Deficiency of other specified B group vitamins: Secondary | ICD-10-CM | POA: Diagnosis not present

## 2024-01-10 DIAGNOSIS — K8020 Calculus of gallbladder without cholecystitis without obstruction: Secondary | ICD-10-CM | POA: Insufficient documentation

## 2024-01-10 DIAGNOSIS — Z1212 Encounter for screening for malignant neoplasm of rectum: Secondary | ICD-10-CM

## 2024-01-10 MED ORDER — VITAMIN D 50 MCG (2000 UT) PO CAPS
3.0000 | ORAL_CAPSULE | Freq: Every day | ORAL | 3 refills | Status: AC
Start: 2024-01-10 — End: ?
  Filled 2024-01-10: qty 100, 33d supply, fill #0

## 2024-01-10 NOTE — Progress Notes (Signed)
 Subjective:  Patient ID: [name], female    DOB: 20-Feb-1976  Age: 48 y.o. MRN: [id number]  Chief Complaint  Patient presents with   Medical Management of Chronic Issues    Discussed the use of AI scribe software for clinical note transcription with the patient, who gave verbal consent to proceed.  History of Present Illness   [name] is a 48 year old female with high blood pressure who presents for follow-up regarding her blood pressure and medication management.  She consumes approximately four cups of coffee daily, which she believes may be affecting her blood pressure. She is currently in school and attributes some of her stress and potential blood pressure elevation to her academic workload. She recently passed a test and has a comprehensive final exam next week.  She started taking phentermine  two days ago to aid with weight management. It has helped reduce her appetite, as she 'didn't eat too much yesterday.' However, she did not take it today because she felt she needed food in her stomach before taking it. No sleep disturbances from the medication.  She experiences morning allergies, which she describes as 'painful every morning.' She has not been consistent with taking her allergy medication, Zyrtec, but plans to take it later.  She has a history of gallstones and is cautious about her diet, particularly avoiding fatty foods. She is aware that phentermine  may decrease her portion sizes, which could impact her gallbladder function.  She has been taking cranberry pills daily to prevent urinary tract infections (UTIs) and reports no symptoms since starting this regimen.  She is due for a mammogram on May 27th and is considering a Cologuard test instead of a colonoscopy.         Well Adult Physical: Patient here for a comprehensive physical exam.The patient reports no problems Do you take any herbs or supplements that were not prescribed by a doctor? no Are you  taking calcium supplements? no Are you taking aspirin daily? no  Encounter for general adult medical examination without abnormal findings  Physical ("At Risk" items are starred): Patient's last physical exam was 1 year ago .  Patient is not afflicted from Stress Incontinence and Urge Incontinence  Patient wears a seat belts Patient has smoke detectors and has carbon monoxide detectors. Patient practices appropriate gun safety. Patient wears sunscreen with extended sun exposure. Dental Care: biannual cleanings, brushes and flosses daily. Ophthalmology/Optometry: Annual visit.  Hearing loss: none Vision impairments: none  Menarche: 48 y/o Menstrual History: Regular LMP: two weeks ago Pregnancy history: G1P1 Safe at home: Yes Self breast exams: No     12/14/2023    9:56 AM 02/11/2023    3:38 PM 02/25/2021    3:27 PM 10/20/2020    9:53 AM 04/16/2015    1:54 PM  Depression screen PHQ 2/9  Decreased Interest 0 0 0 0 0  Down, Depressed, Hopeless 0 0 0 0 0  PHQ - 2 Score 0 0 0 0 0         04/16/2015    1:54 PM 10/20/2020    1:23 PM 10/30/2020    1:59 PM 02/25/2021    3:27 PM 12/14/2023   10:03 AM  Fall Risk  Falls in the past year? No 0 0 0 0  Was there an injury with Fall?  0 0 0 0  Fall Risk Category Calculator  0 0 0 0  Fall Risk Category (Retired)  Low Low Low   (RETIRED) Patient  Fall Risk Level  Low fall risk Low fall risk Low fall risk   Patient at Risk for Falls Due to    No Fall Risks   Fall risk Follow up  Falls evaluation completed Falls evaluation completed Falls evaluation completed              Social Hx   Social History   Socioeconomic History   Marital status: Divorced    Spouse name: Not on file   Number of children: 1   Years of education: 12 + 4   Highest education level: Bachelor's degree (e.g., BA, AB, BS)  Occupational History   Occupation: Engineer, site  Tobacco Use   Smoking status: Never   Smokeless tobacco: Never  Vaping Use   Vaping  status: Never Used  Substance and Sexual Activity   Alcohol use: Yes    Alcohol/week: 2.0 standard drinks of alcohol    Types: 2 Cans of beer per week    Comment: BEER - [email or web address]   Drug use: Never   Sexual activity: Yes    Birth control/protection: OCP    Comment: 1ST INTERCOURSE- 9, PARTNERS- 7  Other Topics Concern   Not on file  Social History Narrative   Not on file   Social Drivers of Health   Financial Resource Strain: Not on file  Food Insecurity: No Food Insecurity (02/11/2023)   Hunger Vital Sign    Worried About Running Out of Food in the Last Year: Never true    Ran Out of Food in the Last Year: Never true  Transportation Needs: No Transportation Needs (02/11/2023)   PRAPARE - Administrator, Civil Service (Medical): No    Lack of Transportation (Non-Medical): No  Physical Activity: Not on file  Stress: Not on file  Social Connections: Not on file   Past Medical History:  Diagnosis Date   AMA (advanced maternal age) multigravida 35+    Medical history non-contributory    Past Surgical History:  Procedure Laterality Date   CESAREAN SECTION  2016   NO PAST SURGERIES      Family History  Problem Relation Age of Onset   Diabetes Mother    High blood pressure Mother    Pulmonary fibrosis Mother    Diabetes Father    High blood pressure Father    High blood pressure Sister    Iron deficiency Sister    Diabetes Sister    Leukemia Paternal Uncle    Diabetes Paternal Grandmother    Diabetes Paternal Grandfather    Breast cancer Neg Hx     Review of Systems  Constitutional:  Negative for chills, fatigue and fever.  HENT:  Negative for congestion, ear pain and sore throat.   Respiratory:  Negative for cough and shortness of breath.   Cardiovascular:  Negative for chest pain and palpitations.  Gastrointestinal:  Negative for abdominal pain, constipation, diarrhea, nausea and vomiting.  Genitourinary:  Negative for difficulty urinating and  dysuria.  Musculoskeletal:  Negative for arthralgias, back pain and myalgias.  Skin:  Negative for rash.  Neurological:  Negative for dizziness and headaches.  Psychiatric/Behavioral:  Negative for dysphoric mood.      Objective:  Ht 5\' 3"  (1.6 m)   Wt 195 lb (88.5 kg)   LMP 12/27/2023   SpO2 98%   BMI 34.54 kg/m      01/10/2024    8:49 AM 12/14/2023    9:56 AM 10/12/2023   10:52 AM  BP/Weight  Systolic BP  122 829  Diastolic BP  82 64  Wt. (Lbs) 195 195 193  BMI 34.54 kg/m2 34.54 kg/m2 34.19 kg/m2    Physical Exam Constitutional:      Appearance: Normal appearance.  HENT:     Right Ear: Tympanic membrane normal.     Left Ear: Tympanic membrane normal.     Nose: Nose normal.     Mouth/Throat:     Pharynx: No oropharyngeal exudate or posterior oropharyngeal erythema.  Eyes:     Conjunctiva/sclera: Conjunctivae normal.  Neck:     Vascular: No carotid bruit.  Cardiovascular:     Rate and Rhythm: Normal rate and regular rhythm.     Heart sounds: Normal heart sounds.  Pulmonary:     Effort: Pulmonary effort is normal.     Breath sounds: Normal breath sounds.  Abdominal:     General: Bowel sounds are normal.     Palpations: Abdomen is soft.     Tenderness: There is no abdominal tenderness.  Skin:    Findings: No lesion or rash.  Neurological:     Mental Status: She is alert and oriented to person, place, and time.  Psychiatric:        Behavior: Behavior normal.     Lab Results  Component Value Date   WBC 10.0 07/04/2023   HGB 12.9 07/04/2023   HCT 39.6 07/04/2023   PLT 365 07/04/2023   GLUCOSE 100 (H) 07/04/2023   CHOL 176 07/04/2023   TRIG 132 07/04/2023   HDL 55 07/04/2023   LDLCALC 98 07/04/2023   ALT 11 07/04/2023   AST 15 07/04/2023   NA 141 07/04/2023   K 4.3 07/04/2023   CL 105 07/04/2023   CREATININE 0.87 07/04/2023   BUN 10 07/04/2023   CO2 21 07/04/2023   TSH 2.550 10/13/2021   HGBA1C 5.8 (H) 07/04/2023      Assessment & Plan:   Wellness Visit Routine wellness visit. Discussed exams, stress management, medication adherence, and lifestyle modifications. Preferred Cologuard for colon cancer screening. - Schedule mammogram for Jan 31, 2024. - Order blood count, CMP, A1c, lipid panel, TSH, vitamin D , B12 tests. - Discuss hepatitis C screening.      Vitamin D  deficiency Assessment & Plan: History of Vitamin D  deficiency Labs drawn today Will continue treatment if labs come back abnormal  Orders: -     VITAMIN D  25 Hydroxy (Vit-D Deficiency, Fractures) -     Vitamin D ; Take 3 capsules (6,000 Units total) by mouth daily.  Dispense: 100 capsule; Refill: 3  B12 deficiency Assessment & Plan: History of low B12 Labs drawn today Will adjust treatment depending on results  Orders: -     Vitamin B12  Obesity (BMI 30.0-34.9) Assessment & Plan: Continue to monitor diet and exercise Labs drawn today Will adjust treatment depending on labs  Orders: -     CBC with Differential/Platelet -     Comprehensive metabolic panel with GFR -     Hemoglobin A1c -     Lipid panel -     TSH  Acute cystitis with hematuria Assessment & Plan: No current UTI symptoms. Discussed prevention strategies. - Continue cranberry supplements. - Consider pure cranberry juice for probiotics.   Asymptomatic gallstones Assessment & Plan: Gallstones with dietary modifications to prevent complications, especially with phentermine  use. - Advise low-fat diet to prevent gallstone complications.   Screening for colorectal cancer -     Cologuard  Screening for viral disease -  Hepatitis C antibody     Body mass index is 34.54 kg/m.   These are the goals we discussed:  Goals   None      This is a list of the screening recommended for you and due dates:  Health Maintenance  Topic Date Due   Hepatitis C Screening  Never done   Colon Cancer Screening  Never done   Mammogram  12/31/2023   Flu Shot  04/06/2024    DTaP/Tdap/Td vaccine (3 - Td or Tdap) 02/06/2028   Pap with HPV screening  12/13/2028   HIV Screening  Completed   HPV Vaccine  Aged Out   Meningitis B Vaccine  Aged Out   COVID-19 Vaccine  Discontinued     Meds ordered this encounter  Medications   Cholecalciferol (VITAMIN D ) 50 MCG (2000 UT) CAPS    Sig: Take 3 capsules (6,000 Units total) by mouth daily.    Dispense:  100 capsule    Refill:  3    Supervising Provider:   Mercy Stall [[id number]]    Follow-up: No follow-ups on file.  An After Visit Summary was printed and given to the patient.  Odilia Bennett, [facility] [phone]

## 2024-01-10 NOTE — Assessment & Plan Note (Signed)
 Gallstones with dietary modifications to prevent complications, especially with phentermine  use. - Advise low-fat diet to prevent gallstone complications.

## 2024-01-10 NOTE — Progress Notes (Deleted)
 Subjective:  Patient ID: [name], female    DOB: [birth year]  Age: 48 y.o. MRN: [id number]  Chief Complaint  Patient presents with   Medical Management of Chronic Issues    HPI:  Discussed the use of AI scribe software for clinical note transcription with the patient, who gave verbal consent to proceed.  History of Present Illness   [name] is a 48 year old female with high blood pressure who presents for follow-up regarding her blood pressure and medication management.  She consumes approximately four cups of coffee daily, which she believes may be affecting her blood pressure. She is currently in school and attributes some of her stress and potential blood pressure elevation to her academic workload. She recently passed a test and has a comprehensive final exam next week.  She started taking phentermine  two days ago to aid with weight management. It has helped reduce her appetite, as she 'didn't eat too much yesterday.' However, she did not take it today because she felt she needed food in her stomach before taking it. No sleep disturbances from the medication.  She experiences morning allergies, which she describes as 'painful every morning.' She has not been consistent with taking her allergy medication, Zyrtec, but plans to take it later.  She has a history of gallstones and is cautious about her diet, particularly avoiding fatty foods. She is aware that phentermine  may decrease her portion sizes, which could impact her gallbladder function.  She has been taking cranberry pills daily to prevent urinary tract infections (UTIs) and reports no symptoms since starting this regimen.  She is due for a mammogram on May 27th and is considering a Cologuard test instead of a colonoscopy.         12/14/2023    9:56 AM 02/11/2023    3:38 PM 02/25/2021    3:27 PM 10/20/2020    9:53 AM 04/16/2015    1:54 PM  Depression screen PHQ 2/9  Decreased Interest 0 0 0 0 0  Down,  Depressed, Hopeless 0 0 0 0 0  PHQ - 2 Score 0 0 0 0 0        12/14/2023   10:03 AM  Fall Risk   Falls in the past year? 0  Number falls in past yr: 0  Injury with Fall? 0    Patient Care Team: Mercy Stall, MD as PCP - General (Family Medicine) Andee Bamberger, NP as Nurse Practitioner (Gynecology)   Review of Systems  Constitutional:  Negative for appetite change, fatigue and fever.  HENT:  Negative for congestion, ear pain, sinus pressure and sore throat.   Respiratory:  Negative for cough, chest tightness, shortness of breath and wheezing.   Cardiovascular:  Negative for chest pain and palpitations.  Gastrointestinal:  Negative for abdominal pain, constipation, diarrhea, nausea and vomiting.  Genitourinary:  Negative for dysuria and hematuria.  Musculoskeletal:  Negative for arthralgias, back pain, joint swelling and myalgias.  Skin:  Negative for rash.  Neurological:  Negative for dizziness, weakness and headaches.  Psychiatric/Behavioral:  Negative for dysphoric mood. The patient is not nervous/anxious.     Current Outpatient Medications on File Prior to Visit  Medication Sig Dispense Refill   CALCIUM PO Take by mouth.     norethindrone -ethinyl estradiol -FE (LOESTRIN  FE) 1-20 MG-MCG tablet Take 1 tablet by mouth daily. 84 tablet 3   OMEPRAZOLE PO Take by mouth.     phentermine  37.5 MG capsule Take 1 capsule (37.5 mg  total) by mouth every morning. 30 capsule 0   VITAMIN D  PO Take by mouth.     No current facility-administered medications on file prior to visit.   Past Medical History:  Diagnosis Date   AMA (advanced maternal age) multigravida 35+    Medical history non-contributory    Past Surgical History:  Procedure Laterality Date   CESAREAN SECTION  2016   NO PAST SURGERIES      Family History  Problem Relation Age of Onset   Diabetes Mother    High blood pressure Mother    Pulmonary fibrosis Mother    Diabetes Father    High blood pressure Father     High blood pressure Sister    Iron deficiency Sister    Diabetes Sister    Leukemia Paternal Uncle    Diabetes Paternal Grandmother    Diabetes Paternal Grandfather    Breast cancer Neg Hx    Social History   Socioeconomic History   Marital status: Divorced    Spouse name: Not on file   Number of children: 1   Years of education: 12 + 4   Highest education level: Bachelor's degree (e.g., BA, AB, BS)  Occupational History   Occupation: Engineer, site  Tobacco Use   Smoking status: Never   Smokeless tobacco: Never  Vaping Use   Vaping status: Never Used  Substance and Sexual Activity   Alcohol use: Yes    Alcohol/week: 2.0 standard drinks of alcohol    Types: 2 Cans of beer per week    Comment: BEER - OCCAS.WEEKENDS   Drug use: Never   Sexual activity: Yes    Birth control/protection: OCP    Comment: 1ST INTERCOURSE- 7, PARTNERS- 7  Other Topics Concern   Not on file  Social History Narrative   Not on file   Social Drivers of Health   Financial Resource Strain: Not on file  Food Insecurity: No Food Insecurity (02/11/2023)   Hunger Vital Sign    Worried About Running Out of Food in the Last Year: Never true    Ran Out of Food in the Last Year: Never true  Transportation Needs: No Transportation Needs (02/11/2023)   PRAPARE - Administrator, Civil Service (Medical): No    Lack of Transportation (Non-Medical): No  Physical Activity: Not on file  Stress: Not on file  Social Connections: Not on file    Objective:  Ht 5\' 3"  (1.6 m)   Wt 195 lb (88.5 kg)   SpO2 98%   BMI 34.54 kg/m      01/10/2024    8:49 AM 12/14/2023    9:56 AM 10/12/2023   10:52 AM  BP/Weight  Systolic BP  122 086  Diastolic BP  82 64  Wt. (Lbs) 195 195 193  BMI 34.54 kg/m2 34.54 kg/m2 34.19 kg/m2    Physical Exam  Diabetic Foot Exam - Simple   No data filed      Lab Results  Component Value Date   WBC 10.0 07/04/2023   HGB 12.9 07/04/2023   HCT 39.6 07/04/2023   PLT  365 07/04/2023   GLUCOSE 100 (H) 07/04/2023   CHOL 176 07/04/2023   TRIG 132 07/04/2023   HDL 55 07/04/2023   LDLCALC 98 07/04/2023   ALT 11 07/04/2023   AST 15 07/04/2023   NA 141 07/04/2023   K 4.3 07/04/2023   CL 105 07/04/2023   CREATININE 0.87 07/04/2023   BUN 10 07/04/2023  CO2 21 07/04/2023   TSH 2.550 10/13/2021   HGBA1C 5.8 (H) 07/04/2023      Assessment & Plan:  Vitamin D  deficiency  B12 deficiency  Obesity (BMI 30.0-34.9)     No orders of the defined types were placed in this encounter.   No orders of the defined types were placed in this encounter.    Follow-up: No follow-ups on file.   I,Lauren M Auman,acting as a Neurosurgeon for [employer], PA.,have documented all relevant documentation on the behalf of Odilia Bennett, PA,as directed by  Odilia Bennett, PA while in the presence of Odilia Bennett, [place].   An After Visit Summary was printed and given to the patient.  Odilia Bennett, [place] [facility] [phone]

## 2024-01-10 NOTE — Patient Instructions (Signed)
 VISIT SUMMARY:  Today, we discussed your blood pressure management, medication use, and overall wellness. We reviewed your current medications and lifestyle habits, and made some adjustments to help manage your conditions more effectively.  YOUR PLAN:  -HYPERTENSION: Hypertension, or high blood pressure, can be influenced by factors like caffeine and medication use. We recommend monitoring your blood pressure, especially after consuming caffeine, and moderating your caffeine intake. Additionally, try to time your phentermine  around meals to avoid any potential issues.  -GALLSTONES: Gallstones are solid particles that form in the gallbladder and can cause pain or complications. To prevent issues, continue following a low-fat diet, especially while taking phentermine .  -ALLERGIC RHINITIS: Allergic rhinitis is an allergic reaction that causes sneezing, congestion, and other symptoms. To manage your morning allergies, take Zyrtec consistently as prescribed.  -URINARY TRACT INFECTIONS: Urinary tract infections (UTIs) are infections in any part of the urinary system. You have no current symptoms, so continue taking cranberry supplements and consider adding pure cranberry juice for additional benefits.  [name] VISIT: During your routine wellness visit, we discussed various health screenings and lifestyle modifications. You are scheduled for a mammogram on Jan 31, 2024. We also ordered blood tests including blood count, CMP, A1c, lipid panel, TSH, vitamin D , and B12. We discussed the option of hepatitis C screening and you preferred the Cologuard test for colon cancer screening.  INSTRUCTIONS:  Please monitor your blood pressure regularly, especially after consuming caffeine. Moderate your caffeine intake and time your phentermine  around meals. Take Zyrtec consistently for your allergies. Continue with your low-fat diet to manage gallstones and take cranberry supplements to prevent UTIs. Remember to go  for your mammogram on Jan 31, 2024, and complete the ordered blood tests. Consider the hepatitis C screening and follow up with the Cologuard test for colon cancer screening.

## 2024-01-10 NOTE — Assessment & Plan Note (Signed)
 History of low B12 Labs drawn today Will adjust treatment depending on results

## 2024-01-10 NOTE — Assessment & Plan Note (Signed)
 Continue to monitor diet and exercise Labs drawn today Will adjust treatment depending on labs

## 2024-01-10 NOTE — Assessment & Plan Note (Signed)
 History of Vitamin D  deficiency Labs drawn today Will continue treatment if labs come back abnormal

## 2024-01-10 NOTE — Assessment & Plan Note (Signed)
 No current UTI symptoms. Discussed prevention strategies. - Continue cranberry supplements. - Consider pure cranberry juice for probiotics.

## 2024-01-11 ENCOUNTER — Other Ambulatory Visit (HOSPITAL_BASED_OUTPATIENT_CLINIC_OR_DEPARTMENT_OTHER): Payer: Self-pay

## 2024-01-11 ENCOUNTER — Encounter: Payer: Self-pay | Admitting: Physician Assistant

## 2024-01-11 LAB — CBC WITH DIFFERENTIAL/PLATELET
Basophils Absolute: 0.1 10*3/uL (ref 0.0–0.2)
Basos: 1 %
EOS (ABSOLUTE): 0.3 10*3/uL (ref 0.0–0.4)
Eos: 3 %
Hematocrit: 40.8 % (ref 34.0–46.6)
Hemoglobin: 13.1 g/dL (ref 11.1–15.9)
Immature Grans (Abs): 0 10*3/uL (ref 0.0–0.1)
Immature Granulocytes: 0 %
Lymphocytes Absolute: 4.9 10*3/uL — ABNORMAL HIGH (ref 0.7–3.1)
Lymphs: 42 %
MCH: 27.9 pg (ref 26.6–33.0)
MCHC: 32.1 g/dL (ref 31.5–35.7)
MCV: 87 fL (ref 79–97)
Monocytes Absolute: 0.6 10*3/uL (ref 0.1–0.9)
Monocytes: 6 %
Neutrophils Absolute: 5.7 10*3/uL (ref 1.4–7.0)
Neutrophils: 48 %
Platelets: 362 10*3/uL (ref 150–450)
RBC: 4.7 x10E6/uL (ref 3.77–5.28)
RDW: 12.9 % (ref 11.7–15.4)
WBC: 11.7 10*3/uL — ABNORMAL HIGH (ref 3.4–10.8)

## 2024-01-11 LAB — COMPREHENSIVE METABOLIC PANEL WITH GFR
ALT: 9 IU/L (ref 0–32)
AST: 11 IU/L (ref 0–40)
Albumin: 4.3 g/dL (ref 3.9–4.9)
Alkaline Phosphatase: 83 IU/L (ref 44–121)
BUN/Creatinine Ratio: 16 (ref 9–23)
BUN: 15 mg/dL (ref 6–24)
Bilirubin Total: 0.3 mg/dL (ref 0.0–1.2)
CO2: 20 mmol/L (ref 20–29)
Calcium: 9.4 mg/dL (ref 8.7–10.2)
Chloride: 105 mmol/L (ref 96–106)
Creatinine, Ser: 0.95 mg/dL (ref 0.57–1.00)
Globulin, Total: 2.8 g/dL (ref 1.5–4.5)
Glucose: 94 mg/dL (ref 70–99)
Potassium: 4.3 mmol/L (ref 3.5–5.2)
Sodium: 139 mmol/L (ref 134–144)
Total Protein: 7.1 g/dL (ref 6.0–8.5)
eGFR: 74 mL/min/{1.73_m2} (ref >59)

## 2024-01-11 LAB — LIPID PANEL
Chol/HDL Ratio: 3.4 ratio (ref 0.0–4.4)
Cholesterol, Total: 165 mg/dL (ref 100–199)
HDL: 48 mg/dL (ref >39)
LDL Chol Calc (NIH): 100 mg/dL — ABNORMAL HIGH (ref 0–99)
Triglycerides: 91 mg/dL (ref 0–149)
VLDL Cholesterol Cal: 17 mg/dL (ref 5–40)

## 2024-01-11 LAB — HEMOGLOBIN A1C
Est. average glucose Bld gHb Est-mCnc: 117 mg/dL
Hgb A1c MFr Bld: 5.7 % — ABNORMAL HIGH (ref 4.8–5.6)

## 2024-01-11 LAB — VITAMIN B12: Vitamin B-12: 234 pg/mL (ref 232–1245)

## 2024-01-11 LAB — HEPATITIS C ANTIBODY: Hep C Virus Ab: NONREACTIVE

## 2024-01-11 LAB — TSH: TSH: 2.64 u[IU]/mL (ref 0.450–4.500)

## 2024-01-11 LAB — VITAMIN D 25 HYDROXY (VIT D DEFICIENCY, FRACTURES): Vit D, 25-Hydroxy: 41.8 ng/mL (ref 30.0–100.0)

## 2024-01-17 ENCOUNTER — Other Ambulatory Visit: Payer: Self-pay | Admitting: Family Medicine

## 2024-01-17 DIAGNOSIS — Z1231 Encounter for screening mammogram for malignant neoplasm of breast: Secondary | ICD-10-CM

## 2024-01-25 ENCOUNTER — Other Ambulatory Visit (HOSPITAL_BASED_OUTPATIENT_CLINIC_OR_DEPARTMENT_OTHER): Payer: Self-pay

## 2024-01-31 ENCOUNTER — Ambulatory Visit
Admission: RE | Admit: 2024-01-31 | Discharge: 2024-01-31 | Disposition: A | Discharge status: Home / Self Care | Source: Ambulatory Visit | Attending: Family Medicine | Admitting: Family Medicine

## 2024-01-31 DIAGNOSIS — Z1231 Encounter for screening mammogram for malignant neoplasm of breast: Secondary | ICD-10-CM

## 2024-02-02 ENCOUNTER — Ambulatory Visit: Payer: Self-pay | Admitting: Family Medicine

## 2024-02-03 ENCOUNTER — Other Ambulatory Visit: Payer: Self-pay | Admitting: Family Medicine

## 2024-02-03 ENCOUNTER — Encounter: Payer: Self-pay | Admitting: Family Medicine

## 2024-02-03 DIAGNOSIS — R928 Other abnormal and inconclusive findings on diagnostic imaging of breast: Secondary | ICD-10-CM

## 2024-02-21 ENCOUNTER — Encounter

## 2024-02-21 ENCOUNTER — Other Ambulatory Visit

## 2024-02-22 ENCOUNTER — Other Ambulatory Visit (HOSPITAL_BASED_OUTPATIENT_CLINIC_OR_DEPARTMENT_OTHER): Payer: Self-pay

## 2024-02-22 ENCOUNTER — Other Ambulatory Visit: Payer: Self-pay | Admitting: Family Medicine

## 2024-02-22 DIAGNOSIS — N898 Other specified noninflammatory disorders of vagina: Secondary | ICD-10-CM

## 2024-02-22 MED ORDER — FLUCONAZOLE 150 MG PO TABS
150.0000 mg | ORAL_TABLET | Freq: Every day | ORAL | 0 refills | Status: AC
  Filled 2024-02-22: qty 2, 2d supply, fill #0

## 2024-02-24 ENCOUNTER — Ambulatory Visit
Admission: RE | Admit: 2024-02-24 | Discharge: 2024-02-24 | Disposition: A | Discharge status: Home / Self Care | Source: Ambulatory Visit | Attending: Family Medicine | Admitting: Family Medicine

## 2024-02-24 ENCOUNTER — Ambulatory Visit

## 2024-02-24 DIAGNOSIS — R928 Other abnormal and inconclusive findings on diagnostic imaging of breast: Secondary | ICD-10-CM

## 2024-02-27 ENCOUNTER — Ambulatory Visit: Payer: Self-pay | Admitting: Family Medicine

## 2024-03-23 ENCOUNTER — Other Ambulatory Visit (HOSPITAL_BASED_OUTPATIENT_CLINIC_OR_DEPARTMENT_OTHER): Payer: Self-pay

## 2024-03-23 ENCOUNTER — Other Ambulatory Visit: Payer: Self-pay | Admitting: Family Medicine

## 2024-03-23 DIAGNOSIS — E66811 Obesity, class 1: Secondary | ICD-10-CM

## 2024-03-23 MED ORDER — PHENTERMINE HCL 37.5 MG PO CAPS
37.5000 mg | ORAL_CAPSULE | ORAL | 0 refills | Status: DC
  Filled 2024-03-23: qty 30, 30d supply, fill #0

## 2024-04-02 ENCOUNTER — Other Ambulatory Visit (HOSPITAL_BASED_OUTPATIENT_CLINIC_OR_DEPARTMENT_OTHER): Payer: Self-pay

## 2024-04-23 ENCOUNTER — Other Ambulatory Visit (HOSPITAL_BASED_OUTPATIENT_CLINIC_OR_DEPARTMENT_OTHER): Payer: Self-pay

## 2024-05-08 ENCOUNTER — Ambulatory Visit: Admitting: Physician Assistant

## 2024-05-08 NOTE — Progress Notes (Deleted)
 Acute Office Visit  Subjective:    Patient ID: [name], female    DOB: 09-Aug-1976, 48 y.o.   MRN: [id number]  No chief complaint on file.   HPI: Patient is in today for ***  Past Medical History:  Diagnosis Date   AMA (advanced maternal age) multigravida 35+    Medical history non-contributory     Past Surgical History:  Procedure Laterality Date   CESAREAN SECTION  2016   NO PAST SURGERIES      Family History  Problem Relation Age of Onset   Diabetes Mother    High blood pressure Mother    Pulmonary fibrosis Mother    Diabetes Father    High blood pressure Father    High blood pressure Sister    Iron deficiency Sister    Diabetes Sister    Leukemia Paternal Uncle    Diabetes Paternal Grandmother    Diabetes Paternal Grandfather    Breast cancer Neg Hx     Social History   Socioeconomic History   Marital status: Divorced    Spouse name: Not on file   Number of children: 1   Years of education: 12 + 4   Highest education level: Bachelor's degree (e.g., BA, AB, BS)  Occupational History   Occupation: Engineer, site  Tobacco Use   Smoking status: Never   Smokeless tobacco: Never  Vaping Use   Vaping status: Never Used  Substance and Sexual Activity   Alcohol use: Yes    Alcohol/week: 2.0 standard drinks of alcohol    Types: 2 Cans of beer per week    Comment: BEER - OCCAS.WEEKENDS   Drug use: Never   Sexual activity: Yes    Birth control/protection: OCP    Comment: 1ST INTERCOURSE- 38, PARTNERS- 7  Other Topics Concern   Not on file  Social History Narrative   Not on file   Social Drivers of Health   Financial Resource Strain: Not on file  Food Insecurity: No Food Insecurity (02/11/2023)   Hunger Vital Sign    Worried About Running Out of Food in the Last Year: Never true    Ran Out of Food in the Last Year: Never true  Transportation Needs: No Transportation Needs (02/11/2023)   PRAPARE - Administrator, Civil Service  (Medical): No    Lack of Transportation (Non-Medical): No  Physical Activity: Not on file  Stress: Not on file  Social Connections: Not on file  Intimate Partner Violence: Not At Risk (02/11/2023)   Humiliation, Afraid, Rape, and Kick questionnaire    Fear of Current or Ex-Partner: No    Emotionally Abused: No    Physically Abused: No    Sexually Abused: No    Outpatient Medications Prior to Visit  Medication Sig Dispense Refill   CALCIUM PO Take by mouth.     Cholecalciferol  (VITAMIN D ) 50 MCG (2000 UT) CAPS Take 3 capsules (6,000 Units total) by mouth daily. 100 capsule 3   norethindrone -ethinyl estradiol -FE (LOESTRIN  FE) 1-20 MG-MCG tablet Take 1 tablet by mouth daily. 84 tablet 3   OMEPRAZOLE PO Take by mouth.     phentermine  37.5 MG capsule Take 1 capsule (37.5 mg total) by mouth every morning. 30 capsule 0   No facility-administered medications prior to visit.    No Known Allergies  Review of Systems  Constitutional:  Negative for appetite change, fatigue and fever.  HENT:  Negative for congestion, ear pain, sinus pressure and sore throat.  Respiratory:  Negative for cough, chest tightness, shortness of breath and wheezing.   Cardiovascular:  Negative for chest pain and palpitations.  Gastrointestinal:  Negative for abdominal pain, constipation, diarrhea, nausea and vomiting.  Genitourinary:  Negative for dysuria and hematuria.  Musculoskeletal:  Negative for arthralgias, back pain, joint swelling and myalgias.  Skin:  Negative for rash.  Neurological:  Negative for dizziness, weakness and headaches.  Psychiatric/Behavioral:  Negative for dysphoric mood. The patient is not nervous/anxious.        Objective:        01/10/2024    4:00 PM 01/10/2024    8:49 AM 12/14/2023    9:56 AM  Vitals with BMI  Height  5' 3 5' 3  Weight  195 lbs 195 lbs  BMI  34.55 34.55  Systolic 132  122  Diastolic 80  82  Pulse   76    No data found.   Physical Exam  Health  Maintenance Due  Topic Date Due   Colonoscopy  Never done   INFLUENZA VACCINE  04/06/2024    There are no preventive care reminders to display for this patient.   Lab Results  Component Value Date   TSH 2.640 01/10/2024   Lab Results  Component Value Date   WBC 11.7 (H) 01/10/2024   HGB 13.1 01/10/2024   HCT 40.8 01/10/2024   MCV 87 01/10/2024   PLT 362 01/10/2024   Lab Results  Component Value Date   NA 139 01/10/2024   K 4.3 01/10/2024   CO2 20 01/10/2024   GLUCOSE 94 01/10/2024   BUN 15 01/10/2024   CREATININE 0.95 01/10/2024   BILITOT 0.3 01/10/2024   ALKPHOS 83 01/10/2024   AST 11 01/10/2024   ALT 9 01/10/2024   PROT 7.1 01/10/2024   ALBUMIN 4.3 01/10/2024   CALCIUM 9.4 01/10/2024   EGFR 74 01/10/2024   Lab Results  Component Value Date   CHOL 165 01/10/2024   Lab Results  Component Value Date   HDL 48 01/10/2024   Lab Results  Component Value Date   LDLCALC 100 (H) 01/10/2024   Lab Results  Component Value Date   TRIG 91 01/10/2024   Lab Results  Component Value Date   CHOLHDL 3.4 01/10/2024   Lab Results  Component Value Date   HGBA1C 5.7 (H) 01/10/2024       Assessment & Plan:  There are no diagnoses linked to this encounter.   No orders of the defined types were placed in this encounter.   No orders of the defined types were placed in this encounter.    Follow-up: No follow-ups on file.  An After Visit Summary was printed and given to the patient.    I,Delancey Moraes M Akili Corsetti,acting as a Neurosurgeon for [employer], PA.,have documented all relevant documentation on the behalf of Nola Angles, PA,as directed by  Nola Angles, PA while in the presence of Nola Angles, [place].    Nola Angles, [place] [facility] [phone]

## 2024-05-10 ENCOUNTER — Ambulatory Visit (INDEPENDENT_AMBULATORY_CARE_PROVIDER_SITE_OTHER): Admitting: Physician Assistant

## 2024-05-10 ENCOUNTER — Encounter: Payer: Self-pay | Admitting: Physician Assistant

## 2024-05-10 ENCOUNTER — Other Ambulatory Visit (HOSPITAL_BASED_OUTPATIENT_CLINIC_OR_DEPARTMENT_OTHER): Payer: Self-pay

## 2024-05-10 VITALS — BP 122/92 | HR 88 | Temp 97.8°F | Ht 63.0 in | Wt 190.0 lb

## 2024-05-10 DIAGNOSIS — J0110 Acute frontal sinusitis, unspecified: Secondary | ICD-10-CM

## 2024-05-10 DIAGNOSIS — R030 Elevated blood-pressure reading, without diagnosis of hypertension: Secondary | ICD-10-CM

## 2024-05-10 DIAGNOSIS — K219 Gastro-esophageal reflux disease without esophagitis: Secondary | ICD-10-CM | POA: Diagnosis not present

## 2024-05-10 DIAGNOSIS — R7303 Prediabetes: Secondary | ICD-10-CM | POA: Diagnosis not present

## 2024-05-10 DIAGNOSIS — F411 Generalized anxiety disorder: Secondary | ICD-10-CM

## 2024-05-10 MED ORDER — AZITHROMYCIN 500 MG PO TABS
500.0000 mg | ORAL_TABLET | Freq: Every day | ORAL | 0 refills | Status: AC
  Filled 2024-05-10: qty 5, 5d supply, fill #0

## 2024-05-10 MED ORDER — PREDNISONE 20 MG PO TABS
ORAL_TABLET | ORAL | 0 refills | Status: AC
  Filled 2024-05-10: qty 18, 9d supply, fill #0

## 2024-05-10 MED ORDER — ALPRAZOLAM 0.5 MG PO TABS
0.5000 mg | ORAL_TABLET | Freq: Two times a day (BID) | ORAL | 0 refills | Status: AC | PRN
  Filled 2024-05-10: qty 20, 10d supply, fill #0

## 2024-05-10 NOTE — Assessment & Plan Note (Signed)
 GERD symptoms controlled with omeprazole, triggered by spicy foods. - Continue omeprazole 40mg  as needed.

## 2024-05-10 NOTE — Assessment & Plan Note (Signed)
 Persistent stress and sleep difficulty due to racing thoughts. - Prescribe Xanax  0.5 mg, instruct to take 0.25 mg at night as needed for sleep. - Advise to use Xanax  nightly for one week, then as needed.

## 2024-05-10 NOTE — Assessment & Plan Note (Signed)
 Blood pressure elevated, likely due to stress and low fluid intake. Phentermine  discontinued. Potential to restart if blood pressure stabilizes. - Monitor blood pressure. - Encourage increased water intake. - Consider restarting phentermine  if blood pressure stabilizes. BP Readings from Last 3 Encounters:  05/10/24 (!) 122/92  01/10/24 132/80  12/14/23 122/82

## 2024-05-10 NOTE — Progress Notes (Signed)
 Acute Office Visit  Subjective:    Patient ID: [name], female    DOB: [birth year], 48 y.o.   MRN: [id number]  Chief Complaint  Patient presents with   Hypertension    HPI: Patient is in today for anxiety and sinus congestions.  Discussed the use of AI scribe software for clinical note transcription with the patient, who gave verbal consent to proceed.  History of Present Illness [name] is a 48 year old female with hypertension who presents with worsening stress and sleep disturbances.  She experiences persistent stress, characterized by constant thoughts about daily tasks. She describes her sleep as insufficient, with her mind continuously occupied with tasks she needs to complete. This has worsened since she started school. She denies panic attacks but experiences headaches, which she attributes to her hypertension.  She uses Xanax , taking 0.25 mg at night to aid sleep, and finds it effective. She has used it previously. Additionally, she takes omeprazole occasionally when consuming spicy food and is uncertain about her current use of B12 supplements.  She reports sinus congestion with minor epistaxis, night sweats, and body aches. Her mother has similar sinus issues, and azithromycin  (Z-Pak) has been effective for her in the past, requiring a 500 mg dose. Her daughter, [name], is experiencing similar symptoms and has a reduced appetite.  She has a history of hypertension. She discontinued phentermine  when her blood pressure reached 140. She is concerned about her kidney function due to her hypertension and mentions a decrease in her GFR.  She has a history of prediabetes with an A1c of 5.7 and is considering checking it again in a month. She also notes a previous high white cell count due to a UTI in May, which she attributes to dehydration.    Past Medical History:  Diagnosis Date   AMA (advanced maternal age) multigravida 35+    Medical history  non-contributory     Past Surgical History:  Procedure Laterality Date   CESAREAN SECTION  2016   NO PAST SURGERIES      Family History  Problem Relation Age of Onset   Diabetes Mother    High blood pressure Mother    Pulmonary fibrosis Mother    Diabetes Father    High blood pressure Father    High blood pressure Sister    Iron deficiency Sister    Diabetes Sister    Leukemia Paternal Uncle    Diabetes Paternal Grandmother    Diabetes Paternal Grandfather    Breast cancer Neg Hx     Social History   Socioeconomic History   Marital status: Divorced    Spouse name: Not on file   Number of children: 1   Years of education: 12 + 4   Highest education level: Bachelor's degree (e.g., BA, AB, BS)  Occupational History   Occupation: Engineer, site  Tobacco Use   Smoking status: Never   Smokeless tobacco: Never  Vaping Use   Vaping status: Never Used  Substance and Sexual Activity   Alcohol use: Yes    Alcohol/week: 2.0 standard drinks of alcohol    Types: 2 Cans of beer per week    Comment: BEER - [email or web address]   Drug use: Never   Sexual activity: Yes    Birth control/protection: OCP    Comment: 1ST INTERCOURSE- 44, PARTNERS- 7  Other Topics Concern   Not on file  Social History Narrative   Not on file   Social Drivers of Health  Financial Resource Strain: Not on file  Food Insecurity: No Food Insecurity (02/11/2023)   Hunger Vital Sign    Worried About Running Out of Food in the Last Year: Never true    Ran Out of Food in the Last Year: Never true  Transportation Needs: No Transportation Needs (02/11/2023)   PRAPARE - Administrator, Civil Service (Medical): No    Lack of Transportation (Non-Medical): No  Physical Activity: Not on file  Stress: Not on file  Social Connections: Not on file  Intimate Partner Violence: Not At Risk (02/11/2023)   Humiliation, Afraid, Rape, and Kick questionnaire    Fear of Current or Ex-Partner: No    Emotionally  Abused: No    Physically Abused: No    Sexually Abused: No    Outpatient Medications Prior to Visit  Medication Sig Dispense Refill   CALCIUM PO Take by mouth.     Cholecalciferol  (VITAMIN D ) 50 MCG (2000 UT) CAPS Take 3 capsules (6,000 Units total) by mouth daily. 100 capsule 3   norethindrone -ethinyl estradiol -FE (LOESTRIN  FE) 1-20 MG-MCG tablet Take 1 tablet by mouth daily. 84 tablet 3   OMEPRAZOLE PO Take by mouth.     phentermine  37.5 MG capsule Take 1 capsule (37.5 mg total) by mouth every morning. 30 capsule 0   No facility-administered medications prior to visit.    No Known Allergies  Review of Systems  Constitutional:  Negative for appetite change, fatigue and fever.  HENT:  Positive for congestion, sinus pressure and sinus pain. Negative for ear pain and sore throat.   Respiratory:  Negative for cough, chest tightness, shortness of breath and wheezing.   Cardiovascular:  Negative for chest pain and palpitations.  Gastrointestinal:  Negative for abdominal pain, constipation, diarrhea, nausea and vomiting.  Genitourinary:  Negative for dysuria and hematuria.  Musculoskeletal:  Negative for arthralgias, back pain, joint swelling and myalgias.  Skin:  Negative for rash.  Neurological:  Negative for dizziness, weakness and headaches.  Psychiatric/Behavioral:  Negative for dysphoric mood. The patient is not nervous/anxious.        Objective:        05/10/2024    8:43 AM 01/10/2024    4:00 PM 01/10/2024    8:49 AM  Vitals with BMI  Height 5' 3  5' 3  Weight 190 lbs  195 lbs  BMI 33.67  34.55  Systolic 122 132   Diastolic 92 80   Pulse 88      Orthostatic VS for the past 72 hrs (Last 3 readings):  Patient Position BP Location  05/10/24 0843 Sitting Left Arm     Physical Exam Vitals reviewed.  Constitutional:      Appearance: Normal appearance.  Cardiovascular:     Rate and Rhythm: Normal rate and regular rhythm.     Heart sounds: Normal heart sounds.   Pulmonary:     Effort: Pulmonary effort is normal.     Breath sounds: Normal breath sounds.  Abdominal:     General: Bowel sounds are normal.     Palpations: Abdomen is soft.     Tenderness: There is no abdominal tenderness.  Neurological:     Mental Status: She is alert and oriented to person, place, and time.  Psychiatric:        Mood and Affect: Mood normal.        Behavior: Behavior normal.     Health Maintenance Due  Topic Date Due   Colonoscopy  Never done  INFLUENZA VACCINE  04/06/2024    There are no preventive care reminders to display for this patient.   Lab Results  Component Value Date   TSH 2.640 01/10/2024   Lab Results  Component Value Date   WBC 11.7 (H) 01/10/2024   HGB 13.1 01/10/2024   HCT 40.8 01/10/2024   MCV 87 01/10/2024   PLT 362 01/10/2024   Lab Results  Component Value Date   NA 139 01/10/2024   K 4.3 01/10/2024   CO2 20 01/10/2024   GLUCOSE 94 01/10/2024   BUN 15 01/10/2024   CREATININE 0.95 01/10/2024   BILITOT 0.3 01/10/2024   ALKPHOS 83 01/10/2024   AST 11 01/10/2024   ALT 9 01/10/2024   PROT 7.1 01/10/2024   ALBUMIN 4.3 01/10/2024   CALCIUM 9.4 01/10/2024   EGFR 74 01/10/2024   Lab Results  Component Value Date   CHOL 165 01/10/2024   Lab Results  Component Value Date   HDL 48 01/10/2024   Lab Results  Component Value Date   LDLCALC 100 (H) 01/10/2024   Lab Results  Component Value Date   TRIG 91 01/10/2024   Lab Results  Component Value Date   CHOLHDL 3.4 01/10/2024   Lab Results  Component Value Date   HGBA1C 5.7 (H) 01/10/2024       Assessment & Plan:  Acute non-recurrent frontal sinusitis Assessment & Plan: Acute frontal sinusitis Congestion, stuffiness, and epistaxis suggest sinusitis with possible viral component. Family history noted. Azithromycin  preferred due to past effectiveness. - Prescribe azithromycin  500 mg for 5 days. - Prescribe prednisone  for congestion.  Orders: -      Azithromycin ; Take 1 tablet (500 mg total) by mouth daily.  Dispense: 5 tablet; Refill: 0 -     predniSONE ; Take 3 tablets (60 mg total) by mouth daily with breakfast for 3 days, THEN 2 tablets (40 mg total) daily with breakfast for 3 days, THEN 1 tablet (20 mg total) daily with breakfast for 3 days.  Dispense: 18 tablet; Refill: 0  GAD (generalized anxiety disorder) Assessment & Plan: Persistent stress and sleep difficulty due to racing thoughts. - Prescribe Xanax  0.5 mg, instruct to take 0.25 mg at night as needed for sleep. - Advise to use Xanax  nightly for one week, then as needed.  Orders: -     ALPRAZolam ; Take 1 tablet (0.5 mg total) by mouth 2 (two) times daily as needed for anxiety.  Dispense: 20 tablet; Refill: 0  Elevated blood pressure reading Assessment & Plan: Blood pressure elevated, likely due to stress and low fluid intake. Phentermine  discontinued. Potential to restart if blood pressure stabilizes. - Monitor blood pressure. - Encourage increased water intake. - Consider restarting phentermine  if blood pressure stabilizes. BP Readings from Last 3 Encounters:  05/10/24 (!) 122/92  01/10/24 132/80  12/14/23 122/82      Gastroesophageal reflux disease, unspecified whether esophagitis present Assessment & Plan: GERD symptoms controlled with omeprazole, triggered by spicy foods. - Continue omeprazole 40mg  as needed.   Prediabetes Assessment & Plan: A1c at 5.7% in May, now 5.5%. - Plan to recheck A1c in one month.       Meds ordered this encounter  Medications   ALPRAZolam  (XANAX ) 0.5 MG tablet    Sig: Take 1 tablet (0.5 mg total) by mouth 2 (two) times daily as needed for anxiety.    Dispense:  20 tablet    Refill:  0   azithromycin  (ZITHROMAX ) 500 MG tablet    Sig: Take  1 tablet (500 mg total) by mouth daily.    Dispense:  5 tablet    Refill:  0   predniSONE  (DELTASONE ) 20 MG tablet    Sig: Take 3 tablets (60 mg total) by mouth daily with breakfast for  3 days, THEN 2 tablets (40 mg total) daily with breakfast for 3 days, THEN 1 tablet (20 mg total) daily with breakfast for 3 days.    Dispense:  18 tablet    Refill:  0    No orders of the defined types were placed in this encounter.    Follow-up: Return in about 4 weeks (around 06/07/2024) for Chronic.  An After Visit Summary was printed and given to the patient.    I,Lauren M Auman,acting as a Neurosurgeon for [employer], [employer].,have documented all relevant documentation on the behalf of Nola Angles, PA,as directed by  Nola Angles, PA while in the presence of Nola Angles, [place].    Nola Angles, [place] [facility] [phone]

## 2024-05-10 NOTE — Assessment & Plan Note (Signed)
 Acute frontal sinusitis Congestion, stuffiness, and epistaxis suggest sinusitis with possible viral component. Family history noted. Azithromycin  preferred due to past effectiveness. - Prescribe azithromycin  500 mg for 5 days. - Prescribe prednisone  for congestion.

## 2024-05-10 NOTE — Assessment & Plan Note (Signed)
 A1c at 5.7% in May, now 5.5%. - Plan to recheck A1c in one month.

## 2024-05-29 ENCOUNTER — Ambulatory Visit (INDEPENDENT_AMBULATORY_CARE_PROVIDER_SITE_OTHER)

## 2024-05-29 DIAGNOSIS — Z23 Encounter for immunization: Secondary | ICD-10-CM

## 2024-06-13 ENCOUNTER — Other Ambulatory Visit

## 2024-06-13 ENCOUNTER — Other Ambulatory Visit: Payer: Self-pay | Admitting: Physician Assistant

## 2024-06-13 DIAGNOSIS — R7303 Prediabetes: Secondary | ICD-10-CM

## 2024-06-13 DIAGNOSIS — E538 Deficiency of other specified B group vitamins: Secondary | ICD-10-CM

## 2024-06-13 DIAGNOSIS — Z111 Encounter for screening for respiratory tuberculosis: Secondary | ICD-10-CM

## 2024-06-14 ENCOUNTER — Other Ambulatory Visit

## 2024-06-14 DIAGNOSIS — Z111 Encounter for screening for respiratory tuberculosis: Secondary | ICD-10-CM | POA: Diagnosis not present

## 2024-06-14 DIAGNOSIS — N289 Disorder of kidney and ureter, unspecified: Secondary | ICD-10-CM | POA: Diagnosis not present

## 2024-06-14 DIAGNOSIS — R7303 Prediabetes: Secondary | ICD-10-CM | POA: Diagnosis not present

## 2024-06-18 LAB — CBC WITH DIFFERENTIAL/PLATELET
Basophils Absolute: 0.1 x10E3/uL (ref 0.0–0.2)
Basos: 1 %
EOS (ABSOLUTE): 0.3 x10E3/uL (ref 0.0–0.4)
Eos: 3 %
Hematocrit: 40.1 % (ref 34.0–46.6)
Hemoglobin: 12.6 g/dL (ref 11.1–15.9)
Immature Grans (Abs): 0 x10E3/uL (ref 0.0–0.1)
Immature Granulocytes: 0 %
Lymphocytes Absolute: 4.4 x10E3/uL — ABNORMAL HIGH (ref 0.7–3.1)
Lymphs: 42 %
MCH: 27.9 pg (ref 26.6–33.0)
MCHC: 31.4 g/dL — ABNORMAL LOW (ref 31.5–35.7)
MCV: 89 fL (ref 79–97)
Monocytes Absolute: 0.5 x10E3/uL (ref 0.1–0.9)
Monocytes: 5 %
Neutrophils Absolute: 5.2 x10E3/uL (ref 1.4–7.0)
Neutrophils: 49 %
Platelets: 394 x10E3/uL (ref 150–450)
RBC: 4.51 x10E6/uL (ref 3.77–5.28)
RDW: 12.4 % (ref 11.7–15.4)
WBC: 10.6 x10E3/uL (ref 3.4–10.8)

## 2024-06-18 LAB — LIPID PANEL
Chol/HDL Ratio: 3.8 ratio (ref 0.0–4.4)
Cholesterol, Total: 178 mg/dL (ref 100–199)
HDL: 47 mg/dL (ref >39)
LDL Chol Calc (NIH): 116 mg/dL — ABNORMAL HIGH (ref 0–99)
Triglycerides: 81 mg/dL (ref 0–149)
VLDL Cholesterol Cal: 15 mg/dL (ref 5–40)

## 2024-06-18 LAB — QUANTIFERON-TB GOLD PLUS
QuantiFERON Mitogen Value: >10 [IU]/mL
QuantiFERON Nil Value: 0.05 [IU]/mL
QuantiFERON TB1 Ag Value: 5.58 [IU]/mL
QuantiFERON TB2 Ag Value: 4.39 [IU]/mL
QuantiFERON-TB Gold Plus: POSITIVE — AB

## 2024-06-18 LAB — VITAMIN B12: Vitamin B-12: 267 pg/mL (ref 232–1245)

## 2024-06-18 LAB — HEMOGLOBIN A1C
Est. average glucose Bld gHb Est-mCnc: 117 mg/dL
Hgb A1c MFr Bld: 5.7 % — ABNORMAL HIGH (ref 4.8–5.6)

## 2024-06-19 ENCOUNTER — Ambulatory Visit: Payer: Self-pay | Admitting: Physician Assistant

## 2024-06-19 DIAGNOSIS — Z111 Encounter for screening for respiratory tuberculosis: Secondary | ICD-10-CM

## 2024-06-20 ENCOUNTER — Ambulatory Visit (HOSPITAL_BASED_OUTPATIENT_CLINIC_OR_DEPARTMENT_OTHER)
Admission: RE | Admit: 2024-06-20 | Discharge: 2024-06-20 | Disposition: A | Discharge status: Home / Self Care | Source: Ambulatory Visit | Attending: Physician Assistant | Admitting: Physician Assistant

## 2024-06-20 DIAGNOSIS — M40209 Unspecified kyphosis, site unspecified: Secondary | ICD-10-CM | POA: Diagnosis not present

## 2024-06-20 DIAGNOSIS — Z111 Encounter for screening for respiratory tuberculosis: Secondary | ICD-10-CM | POA: Insufficient documentation

## 2024-06-20 LAB — COMPREHENSIVE METABOLIC PANEL WITH GFR
ALT: 9 IU/L (ref 0–32)
AST: 11 IU/L (ref 0–40)
Albumin: 4.1 g/dL (ref 3.9–4.9)
Alkaline Phosphatase: 85 IU/L (ref 41–116)
BUN/Creatinine Ratio: 12 (ref 9–23)
BUN: 10 mg/dL (ref 6–24)
Bilirubin Total: 0.2 mg/dL (ref 0.0–1.2)
CO2: 20 mmol/L (ref 20–29)
Calcium: 9.2 mg/dL (ref 8.7–10.2)
Chloride: 104 mmol/L (ref 96–106)
Creatinine, Ser: 0.83 mg/dL (ref 0.57–1.00)
Globulin, Total: 2.8 g/dL (ref 1.5–4.5)
Glucose: 90 mg/dL (ref 70–99)
Potassium: 4.1 mmol/L (ref 3.5–5.2)
Sodium: 141 mmol/L (ref 134–144)
Total Protein: 6.9 g/dL (ref 6.0–8.5)
eGFR: 87 mL/min/1.73 (ref >59)

## 2024-06-20 LAB — SPECIMEN STATUS REPORT

## 2024-06-21 ENCOUNTER — Other Ambulatory Visit: Payer: Self-pay | Admitting: Physician Assistant

## 2024-06-21 ENCOUNTER — Other Ambulatory Visit (HOSPITAL_BASED_OUTPATIENT_CLINIC_OR_DEPARTMENT_OTHER): Payer: Self-pay

## 2024-06-21 MED ORDER — CYCLOBENZAPRINE HCL 5 MG PO TABS
5.0000 mg | ORAL_TABLET | Freq: Three times a day (TID) | ORAL | 1 refills | Status: AC | PRN
  Filled 2024-06-21: qty 30, 10d supply, fill #0

## 2024-06-21 MED ORDER — IBUPROFEN 600 MG PO TABS
600.0000 mg | ORAL_TABLET | Freq: Three times a day (TID) | ORAL | 0 refills | Status: AC | PRN
  Filled 2024-06-21: qty 30, 10d supply, fill #0

## 2024-06-25 ENCOUNTER — Ambulatory Visit: Payer: Self-pay | Admitting: Physician Assistant

## 2024-07-09 ENCOUNTER — Other Ambulatory Visit (HOSPITAL_BASED_OUTPATIENT_CLINIC_OR_DEPARTMENT_OTHER): Payer: Self-pay

## 2024-07-26 ENCOUNTER — Other Ambulatory Visit: Payer: Self-pay | Admitting: Family Medicine

## 2024-07-26 ENCOUNTER — Other Ambulatory Visit (HOSPITAL_BASED_OUTPATIENT_CLINIC_OR_DEPARTMENT_OTHER): Payer: Self-pay

## 2024-07-26 DIAGNOSIS — E66811 Obesity, class 1: Secondary | ICD-10-CM

## 2024-07-26 MED ORDER — PHENTERMINE HCL 37.5 MG PO CAPS
37.5000 mg | ORAL_CAPSULE | ORAL | 0 refills | Status: DC
  Filled 2024-07-26: qty 30, 30d supply, fill #0

## 2024-09-26 ENCOUNTER — Other Ambulatory Visit (HOSPITAL_BASED_OUTPATIENT_CLINIC_OR_DEPARTMENT_OTHER): Payer: Self-pay

## 2024-10-01 LAB — COLOGUARD: COLOGUARD: NEGATIVE

## 2024-10-02 ENCOUNTER — Ambulatory Visit: Payer: Self-pay | Admitting: Physician Assistant

## 2024-10-02 ENCOUNTER — Other Ambulatory Visit: Payer: Self-pay | Admitting: Family Medicine

## 2024-10-02 ENCOUNTER — Other Ambulatory Visit (HOSPITAL_BASED_OUTPATIENT_CLINIC_OR_DEPARTMENT_OTHER): Payer: Self-pay

## 2024-10-02 DIAGNOSIS — E66811 Obesity, class 1: Secondary | ICD-10-CM

## 2024-10-02 MED ORDER — PHENTERMINE HCL 37.5 MG PO CAPS
37.5000 mg | ORAL_CAPSULE | ORAL | 0 refills | Status: AC
  Filled 2024-10-02: qty 30, 30d supply, fill #0
